# Patient Record
Sex: Female | Born: 1940 | Race: Black or African American | Hispanic: No | Smoking: Current every day smoker
Health system: Southern US, Community
[De-identification: ages and names within clinical notes are randomized; demographics above are authoritative.]

## PROBLEM LIST (undated history)

## (undated) DIAGNOSIS — E785 Hyperlipidemia, unspecified: Secondary | ICD-10-CM

## (undated) DIAGNOSIS — K219 Gastro-esophageal reflux disease without esophagitis: Secondary | ICD-10-CM

## (undated) DIAGNOSIS — N200 Calculus of kidney: Secondary | ICD-10-CM

## (undated) DIAGNOSIS — R413 Other amnesia: Secondary | ICD-10-CM

## (undated) DIAGNOSIS — J45909 Unspecified asthma, uncomplicated: Secondary | ICD-10-CM

## (undated) DIAGNOSIS — J449 Chronic obstructive pulmonary disease, unspecified: Secondary | ICD-10-CM

## (undated) DIAGNOSIS — F329 Major depressive disorder, single episode, unspecified: Secondary | ICD-10-CM

## (undated) DIAGNOSIS — D3500 Benign neoplasm of unspecified adrenal gland: Secondary | ICD-10-CM

## (undated) DIAGNOSIS — I5032 Chronic diastolic (congestive) heart failure: Secondary | ICD-10-CM

## (undated) DIAGNOSIS — E669 Obesity, unspecified: Secondary | ICD-10-CM

## (undated) DIAGNOSIS — I10 Essential (primary) hypertension: Secondary | ICD-10-CM

## (undated) DIAGNOSIS — I2510 Atherosclerotic heart disease of native coronary artery without angina pectoris: Secondary | ICD-10-CM

## (undated) DIAGNOSIS — R040 Epistaxis: Secondary | ICD-10-CM

## (undated) HISTORY — PX: KIDNEY STONE SURGERY: SHX686

## (undated) HISTORY — PX: BUNIONECTOMY: SHX129

## (undated) HISTORY — PX: ABDOMINAL HYSTERECTOMY: SHX81

## (undated) HISTORY — DX: Other amnesia: R41.3

## (undated) HISTORY — PX: ROTATOR CUFF REPAIR: SHX139

## (undated) HISTORY — DX: Chronic obstructive pulmonary disease, unspecified: J44.9

## (undated) HISTORY — DX: Chronic diastolic (congestive) heart failure: I50.32

## (undated) HISTORY — DX: Major depressive disorder, single episode, unspecified: F32.9

## (undated) HISTORY — PX: BLADDER SUSPENSION: SHX72

## (undated) HISTORY — PX: CATARACT EXTRACTION: SUR2

## (undated) HISTORY — DX: Essential (primary) hypertension: I10

## (undated) HISTORY — PX: KNEE ARTHROSCOPY: SUR90

## (undated) HISTORY — PX: CARPAL TUNNEL RELEASE: SHX101

## (undated) HISTORY — DX: Epistaxis: R04.0

---

## 1997-12-04 ENCOUNTER — Ambulatory Visit (HOSPITAL_COMMUNITY): Admission: RE | Admit: 1997-12-04 | Discharge: 1997-12-04 | Payer: Self-pay | Admitting: *Deleted

## 1998-01-01 ENCOUNTER — Ambulatory Visit (HOSPITAL_COMMUNITY): Admission: RE | Admit: 1998-01-01 | Discharge: 1998-01-01 | Payer: Self-pay | Admitting: *Deleted

## 1998-02-07 ENCOUNTER — Ambulatory Visit (HOSPITAL_COMMUNITY): Admission: RE | Admit: 1998-02-07 | Discharge: 1998-02-07 | Payer: Self-pay | Admitting: *Deleted

## 1998-04-25 ENCOUNTER — Ambulatory Visit (HOSPITAL_COMMUNITY): Admission: RE | Admit: 1998-04-25 | Discharge: 1998-04-25 | Payer: Self-pay | Admitting: *Deleted

## 1998-04-29 ENCOUNTER — Ambulatory Visit: Admission: RE | Admit: 1998-04-29 | Discharge: 1998-04-29 | Payer: Self-pay | Admitting: Urology

## 1998-05-22 ENCOUNTER — Inpatient Hospital Stay (HOSPITAL_COMMUNITY): Admission: RE | Admit: 1998-05-22 | Discharge: 1998-05-23 | Payer: Self-pay | Admitting: Urology

## 1998-06-20 ENCOUNTER — Ambulatory Visit (HOSPITAL_COMMUNITY): Admission: RE | Admit: 1998-06-20 | Discharge: 1998-06-20 | Payer: Self-pay | Admitting: Urology

## 1998-06-22 ENCOUNTER — Ambulatory Visit (HOSPITAL_COMMUNITY): Admission: RE | Admit: 1998-06-22 | Discharge: 1998-06-22 | Payer: Self-pay | Admitting: *Deleted

## 1998-06-23 ENCOUNTER — Encounter: Payer: Self-pay | Admitting: Interventional Cardiology

## 1998-06-23 ENCOUNTER — Ambulatory Visit (HOSPITAL_COMMUNITY): Admission: RE | Admit: 1998-06-23 | Discharge: 1998-06-23 | Payer: Self-pay | Admitting: Interventional Cardiology

## 1998-10-22 ENCOUNTER — Encounter: Payer: Self-pay | Admitting: *Deleted

## 1998-10-22 ENCOUNTER — Ambulatory Visit (HOSPITAL_COMMUNITY): Admission: RE | Admit: 1998-10-22 | Discharge: 1998-10-22 | Payer: Self-pay | Admitting: *Deleted

## 1998-12-15 ENCOUNTER — Ambulatory Visit (HOSPITAL_COMMUNITY): Admission: RE | Admit: 1998-12-15 | Discharge: 1998-12-15 | Payer: Self-pay | Admitting: *Deleted

## 1998-12-16 ENCOUNTER — Ambulatory Visit (HOSPITAL_COMMUNITY): Admission: RE | Admit: 1998-12-16 | Discharge: 1998-12-16 | Payer: Self-pay | Admitting: *Deleted

## 1998-12-18 ENCOUNTER — Other Ambulatory Visit: Admission: RE | Admit: 1998-12-18 | Discharge: 1998-12-18 | Payer: Self-pay | Admitting: Obstetrics & Gynecology

## 1998-12-23 ENCOUNTER — Ambulatory Visit (HOSPITAL_COMMUNITY): Admission: RE | Admit: 1998-12-23 | Discharge: 1998-12-23 | Payer: Self-pay | Admitting: *Deleted

## 1998-12-26 ENCOUNTER — Encounter: Payer: Self-pay | Admitting: *Deleted

## 1998-12-26 ENCOUNTER — Ambulatory Visit (HOSPITAL_COMMUNITY): Admission: RE | Admit: 1998-12-26 | Discharge: 1998-12-26 | Payer: Self-pay | Admitting: *Deleted

## 1999-01-29 ENCOUNTER — Ambulatory Visit (HOSPITAL_BASED_OUTPATIENT_CLINIC_OR_DEPARTMENT_OTHER): Admission: RE | Admit: 1999-01-29 | Discharge: 1999-01-29 | Payer: Self-pay | Admitting: Orthopedic Surgery

## 1999-02-09 ENCOUNTER — Encounter: Admission: RE | Admit: 1999-02-09 | Discharge: 1999-03-10 | Payer: Self-pay | Admitting: Orthopedic Surgery

## 1999-03-02 ENCOUNTER — Encounter: Payer: Self-pay | Admitting: Emergency Medicine

## 1999-03-02 ENCOUNTER — Encounter: Payer: Self-pay | Admitting: *Deleted

## 1999-03-02 ENCOUNTER — Inpatient Hospital Stay (HOSPITAL_COMMUNITY): Admission: EM | Admit: 1999-03-02 | Discharge: 1999-03-07 | Payer: Self-pay | Admitting: Emergency Medicine

## 1999-03-03 ENCOUNTER — Encounter: Payer: Self-pay | Admitting: *Deleted

## 1999-04-13 ENCOUNTER — Ambulatory Visit (HOSPITAL_COMMUNITY): Admission: RE | Admit: 1999-04-13 | Discharge: 1999-04-13 | Payer: Self-pay | Admitting: *Deleted

## 1999-05-29 ENCOUNTER — Ambulatory Visit (HOSPITAL_COMMUNITY): Admission: RE | Admit: 1999-05-29 | Discharge: 1999-05-29 | Payer: Self-pay | Admitting: *Deleted

## 1999-05-29 ENCOUNTER — Encounter: Payer: Self-pay | Admitting: *Deleted

## 1999-10-09 ENCOUNTER — Ambulatory Visit (HOSPITAL_COMMUNITY): Admission: RE | Admit: 1999-10-09 | Discharge: 1999-10-09 | Payer: Self-pay | Admitting: *Deleted

## 1999-10-09 ENCOUNTER — Encounter: Payer: Self-pay | Admitting: *Deleted

## 1999-11-03 ENCOUNTER — Other Ambulatory Visit: Admission: RE | Admit: 1999-11-03 | Discharge: 1999-11-03 | Payer: Self-pay | Admitting: Obstetrics

## 1999-11-24 ENCOUNTER — Encounter: Admission: RE | Admit: 1999-11-24 | Discharge: 2000-02-22 | Payer: Self-pay | Admitting: Endocrinology

## 1999-12-29 ENCOUNTER — Encounter: Payer: Self-pay | Admitting: *Deleted

## 1999-12-29 ENCOUNTER — Ambulatory Visit (HOSPITAL_COMMUNITY): Admission: RE | Admit: 1999-12-29 | Discharge: 1999-12-29 | Payer: Self-pay | Admitting: *Deleted

## 2000-02-11 ENCOUNTER — Ambulatory Visit (HOSPITAL_COMMUNITY): Admission: RE | Admit: 2000-02-11 | Discharge: 2000-02-11 | Payer: Self-pay | Admitting: Pulmonary Disease

## 2000-02-11 ENCOUNTER — Encounter: Payer: Self-pay | Admitting: Pulmonary Disease

## 2000-06-20 ENCOUNTER — Ambulatory Visit (HOSPITAL_BASED_OUTPATIENT_CLINIC_OR_DEPARTMENT_OTHER): Admission: RE | Admit: 2000-06-20 | Discharge: 2000-06-20 | Payer: Self-pay | Admitting: Podiatry

## 2000-09-12 ENCOUNTER — Ambulatory Visit (HOSPITAL_COMMUNITY): Admission: RE | Admit: 2000-09-12 | Discharge: 2000-09-12 | Payer: Self-pay | Admitting: *Deleted

## 2001-01-03 ENCOUNTER — Ambulatory Visit (HOSPITAL_COMMUNITY): Admission: RE | Admit: 2001-01-03 | Discharge: 2001-01-03 | Payer: Self-pay | Admitting: *Deleted

## 2001-01-03 ENCOUNTER — Encounter: Payer: Self-pay | Admitting: *Deleted

## 2001-01-19 ENCOUNTER — Ambulatory Visit (HOSPITAL_COMMUNITY): Admission: RE | Admit: 2001-01-19 | Discharge: 2001-01-19 | Payer: Self-pay | Admitting: *Deleted

## 2001-01-19 ENCOUNTER — Encounter: Payer: Self-pay | Admitting: *Deleted

## 2001-03-25 ENCOUNTER — Emergency Department (HOSPITAL_COMMUNITY): Admission: EM | Admit: 2001-03-25 | Discharge: 2001-03-25 | Payer: Self-pay | Admitting: Emergency Medicine

## 2001-03-25 ENCOUNTER — Encounter: Payer: Self-pay | Admitting: Emergency Medicine

## 2001-05-09 ENCOUNTER — Ambulatory Visit (HOSPITAL_COMMUNITY): Admission: RE | Admit: 2001-05-09 | Discharge: 2001-05-09 | Payer: Self-pay | Admitting: *Deleted

## 2001-05-09 ENCOUNTER — Encounter: Payer: Self-pay | Admitting: *Deleted

## 2001-05-24 ENCOUNTER — Ambulatory Visit (HOSPITAL_COMMUNITY): Admission: RE | Admit: 2001-05-24 | Discharge: 2001-05-24 | Payer: Self-pay | Admitting: *Deleted

## 2001-08-24 ENCOUNTER — Ambulatory Visit (HOSPITAL_COMMUNITY): Admission: RE | Admit: 2001-08-24 | Discharge: 2001-08-24 | Payer: Self-pay | Admitting: *Deleted

## 2001-08-24 ENCOUNTER — Encounter (INDEPENDENT_AMBULATORY_CARE_PROVIDER_SITE_OTHER): Payer: Self-pay | Admitting: Specialist

## 2001-10-27 ENCOUNTER — Encounter: Payer: Self-pay | Admitting: *Deleted

## 2001-10-27 ENCOUNTER — Ambulatory Visit (HOSPITAL_COMMUNITY): Admission: RE | Admit: 2001-10-27 | Discharge: 2001-10-27 | Payer: Self-pay | Admitting: *Deleted

## 2002-01-24 ENCOUNTER — Ambulatory Visit (HOSPITAL_COMMUNITY): Admission: RE | Admit: 2002-01-24 | Discharge: 2002-01-24 | Payer: Self-pay | Admitting: *Deleted

## 2002-01-24 ENCOUNTER — Encounter: Payer: Self-pay | Admitting: *Deleted

## 2002-02-20 ENCOUNTER — Ambulatory Visit (HOSPITAL_COMMUNITY): Admission: RE | Admit: 2002-02-20 | Discharge: 2002-02-20 | Payer: Self-pay | Admitting: *Deleted

## 2002-02-20 ENCOUNTER — Encounter: Payer: Self-pay | Admitting: *Deleted

## 2002-12-17 ENCOUNTER — Ambulatory Visit (HOSPITAL_COMMUNITY): Admission: RE | Admit: 2002-12-17 | Discharge: 2002-12-17 | Payer: Self-pay | Admitting: *Deleted

## 2002-12-17 ENCOUNTER — Encounter: Payer: Self-pay | Admitting: *Deleted

## 2002-12-27 ENCOUNTER — Inpatient Hospital Stay (HOSPITAL_COMMUNITY): Admission: EM | Admit: 2002-12-27 | Discharge: 2002-12-28 | Payer: Self-pay | Admitting: Emergency Medicine

## 2002-12-27 ENCOUNTER — Encounter: Payer: Self-pay | Admitting: Emergency Medicine

## 2002-12-28 ENCOUNTER — Encounter: Payer: Self-pay | Admitting: Interventional Cardiology

## 2003-06-14 ENCOUNTER — Encounter: Payer: Self-pay | Admitting: Emergency Medicine

## 2003-06-14 ENCOUNTER — Emergency Department (HOSPITAL_COMMUNITY): Admission: EM | Admit: 2003-06-14 | Discharge: 2003-06-14 | Payer: Self-pay | Admitting: Psychology

## 2004-03-26 ENCOUNTER — Encounter: Admission: RE | Admit: 2004-03-26 | Discharge: 2004-06-24 | Payer: Self-pay | Admitting: Endocrinology

## 2004-08-19 ENCOUNTER — Ambulatory Visit (HOSPITAL_COMMUNITY): Admission: RE | Admit: 2004-08-19 | Discharge: 2004-08-19 | Payer: Self-pay | Admitting: Internal Medicine

## 2005-08-24 ENCOUNTER — Ambulatory Visit (HOSPITAL_COMMUNITY): Admission: RE | Admit: 2005-08-24 | Discharge: 2005-08-24 | Payer: Self-pay | Admitting: Internal Medicine

## 2006-08-25 ENCOUNTER — Ambulatory Visit (HOSPITAL_COMMUNITY): Admission: RE | Admit: 2006-08-25 | Discharge: 2006-08-25 | Payer: Self-pay | Admitting: Internal Medicine

## 2006-11-01 ENCOUNTER — Encounter (HOSPITAL_COMMUNITY): Admission: RE | Admit: 2006-11-01 | Discharge: 2007-01-03 | Payer: Self-pay | Admitting: Interventional Cardiology

## 2007-08-28 ENCOUNTER — Ambulatory Visit (HOSPITAL_COMMUNITY): Admission: RE | Admit: 2007-08-28 | Discharge: 2007-08-28 | Payer: Self-pay | Admitting: Internal Medicine

## 2007-09-25 ENCOUNTER — Inpatient Hospital Stay (HOSPITAL_BASED_OUTPATIENT_CLINIC_OR_DEPARTMENT_OTHER): Admission: RE | Admit: 2007-09-25 | Discharge: 2007-09-25 | Payer: Self-pay | Admitting: Interventional Cardiology

## 2007-09-25 HISTORY — PX: CORONARY STENT PLACEMENT: SHX1402

## 2007-10-02 ENCOUNTER — Inpatient Hospital Stay (HOSPITAL_COMMUNITY): Admission: RE | Admit: 2007-10-02 | Discharge: 2007-10-03 | Payer: Self-pay | Admitting: Interventional Cardiology

## 2008-01-11 ENCOUNTER — Encounter (HOSPITAL_COMMUNITY): Admission: RE | Admit: 2008-01-11 | Discharge: 2008-04-10 | Payer: Self-pay | Admitting: Interventional Cardiology

## 2008-05-08 ENCOUNTER — Encounter: Admission: RE | Admit: 2008-05-08 | Discharge: 2008-05-08 | Payer: Self-pay | Admitting: Internal Medicine

## 2008-05-13 ENCOUNTER — Emergency Department (HOSPITAL_COMMUNITY): Admission: EM | Admit: 2008-05-13 | Discharge: 2008-05-14 | Payer: Self-pay | Admitting: Emergency Medicine

## 2008-05-28 ENCOUNTER — Telehealth: Payer: Self-pay | Admitting: Gastroenterology

## 2008-06-04 ENCOUNTER — Ambulatory Visit: Payer: Self-pay | Admitting: Gastroenterology

## 2008-06-04 DIAGNOSIS — R1011 Right upper quadrant pain: Secondary | ICD-10-CM

## 2008-06-04 LAB — CONVERTED CEMR LAB
ALT: 11 units/L (ref 0–35)
Albumin: 3.6 g/dL (ref 3.5–5.2)
Alkaline Phosphatase: 52 units/L (ref 39–117)
BUN: 11 mg/dL (ref 6–23)
Calcium: 9.7 mg/dL (ref 8.4–10.5)
Chloride: 107 meq/L (ref 96–112)
Creatinine, Ser: 0.8 mg/dL (ref 0.4–1.2)
Eosinophils Absolute: 0.2 10*3/uL (ref 0.0–0.7)
Glucose, Bld: 140 mg/dL — ABNORMAL HIGH (ref 70–99)
Hemoglobin: 13.6 g/dL (ref 12.0–15.0)
Monocytes Absolute: 0.4 10*3/uL (ref 0.1–1.0)
Monocytes Relative: 6.5 % (ref 3.0–12.0)
Neutrophils Relative %: 66.9 % (ref 43.0–77.0)
Platelets: 199 10*3/uL (ref 150–400)
RBC: 4.62 M/uL (ref 3.87–5.11)
RDW: 15.1 % — ABNORMAL HIGH (ref 11.5–14.6)
Sodium: 141 meq/L (ref 135–145)
Total Bilirubin: 0.7 mg/dL (ref 0.3–1.2)

## 2008-06-06 ENCOUNTER — Ambulatory Visit (HOSPITAL_COMMUNITY): Admission: RE | Admit: 2008-06-06 | Discharge: 2008-06-06 | Payer: Self-pay | Admitting: Gastroenterology

## 2008-06-07 ENCOUNTER — Encounter: Payer: Self-pay | Admitting: Gastroenterology

## 2008-07-02 ENCOUNTER — Ambulatory Visit: Payer: Self-pay | Admitting: Gastroenterology

## 2008-07-08 ENCOUNTER — Ambulatory Visit: Payer: Self-pay | Admitting: Gastroenterology

## 2008-07-11 ENCOUNTER — Ambulatory Visit (HOSPITAL_COMMUNITY): Admission: RE | Admit: 2008-07-11 | Discharge: 2008-07-11 | Payer: Self-pay | Admitting: Gastroenterology

## 2008-07-13 ENCOUNTER — Emergency Department (HOSPITAL_COMMUNITY): Admission: EM | Admit: 2008-07-13 | Discharge: 2008-07-13 | Payer: Self-pay | Admitting: Emergency Medicine

## 2008-11-12 ENCOUNTER — Encounter: Admission: RE | Admit: 2008-11-12 | Discharge: 2008-11-12 | Payer: Self-pay | Admitting: Endocrinology

## 2009-05-13 ENCOUNTER — Encounter: Admission: RE | Admit: 2009-05-13 | Discharge: 2009-05-13 | Payer: Self-pay | Admitting: Endocrinology

## 2010-05-04 ENCOUNTER — Emergency Department (HOSPITAL_COMMUNITY): Admission: EM | Admit: 2010-05-04 | Discharge: 2010-05-04 | Payer: Self-pay | Admitting: Family Medicine

## 2010-09-20 ENCOUNTER — Inpatient Hospital Stay (HOSPITAL_COMMUNITY)
Admission: EM | Admit: 2010-09-20 | Discharge: 2010-09-24 | Disposition: A | Discharge status: Still In Hospital | Payer: Self-pay | Source: Home / Self Care | Attending: Emergency Medicine | Admitting: Emergency Medicine

## 2010-09-21 ENCOUNTER — Inpatient Hospital Stay (HOSPITAL_COMMUNITY): Admission: EM | Admit: 2010-09-21 | Discharge: 2010-09-24 | Payer: Self-pay | Admitting: Urology

## 2010-11-03 ENCOUNTER — Ambulatory Visit
Admission: RE | Admit: 2010-11-03 | Discharge: 2010-11-03 | Payer: Self-pay | Source: Home / Self Care | Attending: Urology | Admitting: Urology

## 2010-11-09 LAB — POCT I-STAT 4, (NA,K, GLUC, HGB,HCT)
Glucose, Bld: 187 mg/dL — ABNORMAL HIGH (ref 70–99)
HCT: 42 % (ref 36.0–46.0)
Hemoglobin: 14.3 g/dL (ref 12.0–15.0)
Potassium: 4.5 mEq/L (ref 3.5–5.1)
Sodium: 141 mEq/L (ref 135–145)

## 2010-11-09 LAB — GLUCOSE, CAPILLARY: Glucose-Capillary: 203 mg/dL — ABNORMAL HIGH (ref 70–99)

## 2010-11-17 NOTE — Op Note (Signed)
Caroline Medina, Caroline Medina             ACCOUNT NO.:  [id number]  MEDICAL RECORD NO.:  [id number]          PATIENT TYPE:  AMB  LOCATION:  [facility]                         FACILITY:  [facility]  PHYSICIAN:  Danae Chen, M.D.  DATE OF BIRTH:  08-25-1941  DATE OF PROCEDURE:  11/03/2010 DATE OF DISCHARGE:                              OPERATIVE REPORT   PREOPERATIVE DIAGNOSES:  Right ureteral stone and urosepsis.  POSTOPERATIVE DIAGNOSES:  Right ureteral stone and urosepsis.  PROCEDURE:  Cystoscopy, right retrograde pyelogram, ureteroscopy, holmium laser of right ureteral stone, fragmentation of a right lower pole renal calculus, stone extraction and insertion of double-J stent.  SURGEON:  Danae Chen, M.D.  ANESTHESIA:  General.  INDICATIONS:  The patient is a 70 year old female who had a double-J stent inserted on September 21, 2010, for a 5-mm proximal right ureteral calculus, hydronephrosis and urosepsis.  She was treated with Rocephin for E. coli UTI and sent home on Septra.  She is now admitted for cystoscopy, ureteroscopy, retrograde pyelogram and holmium laser of the ureteral stone and stone extraction.  The patient was identified by her wrist band and proper time-out was taken.  Under general anesthesia, she was prepped and draped and placed in the dorsal lithotomy position.  A panendoscope was inserted in the bladder. The bladder mucosa is normal.  There is some edema around the right ureteral orifice where the distal curl of the double-J stent is seen. There is no stone or tumor in the bladder.  The double-J stent was grasped with a grasping forceps and pulled out of the urethra.  Then, a sensor wire was passed through the double-J stent up into the renal pelvis.  The double-J stent was then removed.  A cone-tip catheter was passed through the cystoscope into the right ureteral orifice.  Contrast was then injected through the cone-tip catheter.  The distal and mid ureter appear  normal.  There is a filling defect in the proximal ureter consistent with the known ureteral calculus.  The renal pelvis and calices appear normal.  The cone-tip catheter was then removed.  The ureteroscope access sheath was then passed over the guidewire up to the level of the known ureteral stone and the sensor wire was removed. A digital ureteroscope was then passed through the ureteroscope access sheath and the stone was visualized in the proximal ureter.  With the holmium laser, the stone was then fragmented in smaller fragments and the stone fragments were removed with the nitinol basket.  The ureteroscope was then advanced in the renal pelvis.  The calices were carefully examined.  There is a stone in the lower pole calyx.  The stone was caught within the wire of the stone basket and the stone is friable and the stone was fragmented in multiple small fragments.  The larger stone fragments were then removed with the nitinol basket.  The smaller fragments could not be caught within the wires of the basket and they are very small and she should be able to pass them spontaneously.  The sensor wire was then passed through the ureteroscope access sheath and the ureteroscope access sheath was removed.  The guidewire was then backloaded into the cystoscope and a #6-French - 26 double-J stent was passed over the guidewire.  The proximal curl of the double-J stent is in the renal pelvis.  The distal curl is in the bladder.  The bladder was then emptied and the cystoscope and guidewire were removed.  The patient tolerated the procedure well and left the OR in satisfactory condition to post anesthesia care unit.     Danae Chen, M.D.     MN/MEDQ  D:  11/03/2010  T:  11/03/2010  Job:  [id number]  cc:   Della Goo, M.D. Fax: [phone]  Electronically Signed by Lindaann Slough M.D. on 11/17/2010 11:23:40 AM

## 2011-01-05 LAB — GLUCOSE, CAPILLARY
Glucose-Capillary: 214 mg/dL — ABNORMAL HIGH (ref 70–99)
Glucose-Capillary: 217 mg/dL — ABNORMAL HIGH (ref 70–99)
Glucose-Capillary: 220 mg/dL — ABNORMAL HIGH (ref 70–99)
Glucose-Capillary: 224 mg/dL — ABNORMAL HIGH (ref 70–99)
Glucose-Capillary: 229 mg/dL — ABNORMAL HIGH (ref 70–99)
Glucose-Capillary: 236 mg/dL — ABNORMAL HIGH (ref 70–99)

## 2011-01-05 LAB — BASIC METABOLIC PANEL
GFR calc non Af Amer: 49 mL/min — ABNORMAL LOW (ref >60)
Glucose, Bld: 228 mg/dL — ABNORMAL HIGH (ref 70–99)
Sodium: 138 mEq/L (ref 135–145)

## 2011-01-06 LAB — COMPREHENSIVE METABOLIC PANEL
ALT: 11 U/L (ref 0–35)
BUN: 12 mg/dL (ref 6–23)
CO2: 24 mEq/L (ref 19–32)
Calcium: 9.2 mg/dL (ref 8.4–10.5)
GFR calc non Af Amer: 51 mL/min — ABNORMAL LOW (ref >60)
Glucose, Bld: 183 mg/dL — ABNORMAL HIGH (ref 70–99)
Sodium: 139 mEq/L (ref 135–145)

## 2011-01-06 LAB — COMPREHENSIVE METABOLIC PANEL WITH GFR
AST: 19 U/L (ref 0–37)
Albumin: 3.7 g/dL (ref 3.5–5.2)
Alkaline Phosphatase: 64 U/L (ref 39–117)
Chloride: 108 meq/L (ref 96–112)
Creatinine, Ser: 1.07 mg/dL (ref 0.4–1.2)
GFR calc Af Amer: >60 mL/min (ref >60)
Potassium: 4.2 meq/L (ref 3.5–5.1)
Total Bilirubin: 0.9 mg/dL (ref 0.3–1.2)
Total Protein: 7.6 g/dL (ref 6.0–8.3)

## 2011-01-06 LAB — URINE MICROSCOPIC-ADD ON

## 2011-01-06 LAB — DIFFERENTIAL
Basophils Absolute: 0 K/uL (ref 0.0–0.1)
Basophils Relative: 0 % (ref 0–1)
Eosinophils Absolute: 0 10*3/uL (ref 0.0–0.7)
Eosinophils Relative: 0 % (ref 0–5)
Lymphocytes Relative: 3 % — ABNORMAL LOW (ref 12–46)
Lymphs Abs: 0.6 10*3/uL — ABNORMAL LOW (ref 0.7–4.0)
Monocytes Absolute: 0.8 K/uL (ref 0.1–1.0)
Monocytes Relative: 5 % (ref 3–12)
Neutro Abs: 15.9 10*3/uL — ABNORMAL HIGH (ref 1.7–7.7)
Neutrophils Relative %: 92 % — ABNORMAL HIGH (ref 43–77)

## 2011-01-06 LAB — URINE CULTURE
Colony Count: >=100000
Culture  Setup Time: 201111280011

## 2011-01-06 LAB — CULTURE, BLOOD (ROUTINE X 2)
Culture  Setup Time: 201111280009
Culture  Setup Time: 201111280009

## 2011-01-06 LAB — URINALYSIS, ROUTINE W REFLEX MICROSCOPIC
Bilirubin Urine: NEGATIVE
Glucose, UA: NEGATIVE mg/dL
Ketones, ur: NEGATIVE mg/dL
Nitrite: NEGATIVE
Protein, ur: NEGATIVE mg/dL
Specific Gravity, Urine: 1.019 (ref 1.005–1.030)
Urobilinogen, UA: 1 mg/dL (ref 0.0–1.0)
pH: 5.5 (ref 5.0–8.0)

## 2011-01-06 LAB — CBC
HCT: 44.6 % (ref 36.0–46.0)
Hemoglobin: 14.7 g/dL (ref 12.0–15.0)
MCH: 29.5 pg (ref 26.0–34.0)
MCHC: 33 g/dL (ref 30.0–36.0)
MCV: 89.4 fL (ref 78.0–100.0)
Platelets: 200 K/uL (ref 150–400)
RBC: 4.99 MIL/uL (ref 3.87–5.11)
RDW: 14.7 % (ref 11.5–15.5)
WBC: 17.2 K/uL — ABNORMAL HIGH (ref 4.0–10.5)

## 2011-01-06 LAB — LIPASE, BLOOD: Lipase: 32 U/L (ref 11–59)

## 2011-03-09 NOTE — Cardiovascular Report (Signed)
Caroline Medina, Caroline Medina NO.:  [id number]   MEDICAL RECORD NO.:  [id number]          PATIENT TYPE:  OIB   LOCATION:  1962                         FACILITY:  [facility]   PHYSICIAN:  Lyn Records, M.D.   DATE OF BIRTH:  1941/08/27   DATE OF PROCEDURE:  09/25/2007  DATE OF DISCHARGE:  09/25/2007                            CARDIAC CATHETERIZATION   REFERRING PHYSICIAN:  Georgann Housekeeper, M.D.   INDICATION:  Recurring episodes of chest discomfort at rest.  The  patient does not use sublingual nitroglycerin because of headache.  She  has a previous history of multiple balloon angioplasties in the mid  1990s by Dr. Kandace Blitz.   PROCEDURE PERFORMED:  1. Left heart catheterization.  2. Selective coronary angiography.  3. Left ventriculography.  4. Intracoronary nitroglycerin administration.   DESCRIPTION:  After informed consent and following 2 mg of IV Versed, a  4-French sheath was placed in the right femoral artery using the  modified Seldinger technique.  A 4-French A2 multipurpose catheter was  then used for hemodynamic recordings, left ventriculography by hand  injection, and selective right coronary angiography.  We also gave the  patient 200 mcg of intracoronary nitroglycerin via this catheter.   We removed the multipurpose catheter and used a #4 and also a #5 4-  French left Judkins catheter for left coronary artery angiography.  The  #4 left Judkins selectively engaged the LAD and the #5 4-French left  Judkins catheter selectively engaged the circumflex.  The patient had a  very short left main.  No complications occurred.  Hemostasis was  achieved with manual compression.   RESULTS:  1. Hemodynamic data:      a.     Aortic pressure 152/86.      b.     Left ventricular pressure 156/16.  2. Left ventriculography:  Left ventricular cavity may be mildly      dilated.  LV function is normal.  No regurgitation is noted.  EF is      estimated to be 60%.  There may  be mild inferior wall hypokinesis.  3. Coronary angiography.      a.     Left main coronary:  The left main coronary is relatively       short and contains no significant obstruction.      b.     Left anterior descending coronary:  LAD is moderately       calcified, especially in the proximal and mid segment.  There is       eccentric 50% LAD obstruction in the origin of the first septal       perforator.  The LAD is otherwise irregular and wraps around the       left ventricular apex.  The LAD gives origin to the 2 diagonal       branches.      c.     Circumflex artery:  Circumflex coronary artery is a large       vessel that gives origin to 2 obtuse marginal branches.  The first       obtuse marginal arises  proximally and contains luminal       irregularities, but no high-grade obstruction.  The second obtuse       marginal is also large and bifurcates.  It also contains luminal       irregularities.  No high-grade obstruction is seen.      d.     Right coronary:  The right coronary is a dominant tortuous       vessel.  There is a proximal shepherd's crook.  The mid vessel       contains a region of calcification and an eccentric 65-75%       stenosis.  There also appears to be a high-grade stenosis in the       distal vessel within a very tight bend; this region is calcified.       This region could not be clearly laid out with the images obtained       during this diagnostic procedure.  There appears to be a 70-80%       stenosis in this region, although contrast variability may be       preventing ability to adequately assess this region.  The PDA has       large 3 left ventricular branches that arise distal to the PDA.   CONCLUSION:  1. Probably significant right coronary disease with a borderline      significant mid right coronary artery stenosis and what appears to      be a tight stenosis in the right coronary artery within a tight      bend within the region of  calcification.  The distal bed is large.      The left anterior descending and circumflex contain irregularities,      but no high-grade obstruction is noted.  2. Normal left ventricular function.   PLAN:  Consider PCI on the mid and distal RCA.      Lyn Records, M.D.  Electronically Signed     HWS/MEDQ  D:  09/25/2007  T:  09/26/2007  Job:  [id number]

## 2011-03-09 NOTE — Cardiovascular Report (Signed)
Caroline Medina, Caroline Medina             ACCOUNT NO.:  [id number]   MEDICAL RECORD NO.:  [id number]          PATIENT TYPE:  OIB   LOCATION:  6532                         FACILITY:  [facility]   PHYSICIAN:  Lyn Records, M.D.   DATE OF BIRTH:  Aug 09, 1941   DATE OF PROCEDURE:  10/02/2007  DATE OF DISCHARGE:                            CARDIAC CATHETERIZATION   INDICATION:  Severe distal right coronary artery stenosis within a  greater than 40-degree bend beyond an area of tortuosity and also  moderate mid RCA disease.   PROCEDURES PERFORMED:  1. Drug-eluting stent implantation distal right coronary artery within      the region of tortuosity.  2. Drug-eluting stent implantation in mid right coronary artery.  3. Buddy wire.  4. Angio-Seal.   DESCRIPTION:  The patient was brought to cath lab.  She had been  diagnosed of having right coronary disease in the [facility] lab.  We knew that  there was moderate-to-moderately severe disease in the mid right  coronary, but there was also suspicion that there was a distal RCA  lesion hidden by tortuosity within an acutely angulated region of the  distal right coronary.  We were able to lay this out in the cath lab,  although not as well as I would have liked to.  This region contains at  least a 90% stenosis in the distal right coronary with a 45-50 degrees  angle.  We were able to cross the stenosis using the [facility] Prowater  guidewire.  Had to use multiple projections to facilitate crossing.  Once across, we were able to dilate with a 3.0 x 15 and 3.0 x 20-mm long  Maverick balloon.  We had difficulty getting the balloons to go across  the stenosis because of the angulation and tortuosity.  With this in  mind, I went ahead and placed a BMW buddy wire.  This gave [place] better  stability in the vessel and allowed [place] to get a PROMUS 3.0 x 23-mm long  stent into position.  We deployed the stent to pressures of 14  atmospheres.  We then used a Dura Star 10-mm long  post-dilatation  noncompliant balloon and did 3 balloon inflations up to 16 atmospheres  distally in the mid and in the proximal portion of the implanted stent.   We then turned our attention to the mid right coronary.  This required  also a buddy wire to get the stent down the vessel into position.  We  primarily stented the mid right coronary with a 3.5 x 15-mm long PROMUS  stent.  We post-dilated with a 12-mm long Quantum 3.75-mm diameter  balloon to 17 atmospheres.  Intracoronary nitroglycerin was given  postprocedure and a followup angiography was performed demonstrating a  nice angiographic result.   The patient received a bolus and infusion of Angiomax.  ACT was  documented to be greater than 300.  The patient had been started on  Plavix a week prior to the procedure.  The patient received 300 mg of  Plavix on the morning of the procedure.  The ASAHI Prowater wire was  used  primarily to cross the initial lesions in the right coronary and  then we used a BMW wire to serve as a buddy support.  We used a  shepherd's crook 3.5 #6-French right coronary guide catheter that  provided reasonably good support.   Angio-Seal was performed with good hemostasis.   CONCLUSION:  1. Successful deployment of a 23 x 3.0-mm PROMUS stent into the distal      right coronary beyond significant regions of tortuosity and      postdilated to 3.3-mm diameter.  2. Successful deployment of the PROMUS drug-eluting stent in the mid      RCA with reduction in stenosis from 70% to 0%.  3. TIMI grade 3 flow was noted beyond the distal most placed stent and      a good angiographic result was felt to be present.   PLAN:  Aspirin and Plavix for a year.  Discharge in a.m. if no bleeding  or other complications.      Lyn Records, M.D.  Electronically Signed     HWS/MEDQ  D:  10/02/2007  T:  10/03/2007  Job:  [id number]

## 2011-03-12 NOTE — Op Note (Signed)
[facility]  Patient:    Caroline Medina, Caroline Medina                    MRN: [id number] Proc. Date: 06/20/00 Adm. Date:  [id number] Attending:  Nelma Rothman                           Operative Report  DATE OF BIRTH:  24-Jan-1941  SURGEON:  Ezequiel Kayser. Ajooney, D.P.M.  ASSISTANT:  Cordella Register, D.P.M.  PREOPERATIVE DIAGNOSES: 1. 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 2. Painful hyperkeratotic lesion with prominent sesamoid right foot tibial sesamoid. 3. Dorsal exosectomy right foot.  POSTOPERATIVE DIAGNOSES: 1. 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 2. Painful hyperkeratotic lesion with prominent sesamoid right foot tibial sesamoid. 3. Dorsal exosectomy right foot.  PROCEDURES: 1. Bunionectomy with osteotomy and internal fixation utilizing Bionx pin - right foot. 2. Partial excision tibial sesamoid - right foot. 3. Dorsal exostectomy - right foot.  ANESTHESIA:  MAC with local.  COMPLICATIONS:  None.  HEMOSTASIS:  Pneumatic ankle tourniquet inflated 250 mmHg.  ESTIMATED BLOOD LOSS:  Less than 5 cc.  DESCRIPTION OF PROCEDURE:  Patient was brought into the OR and placed in supine position, at which time, monitored anesthesia was administered.  A local block was performed with 18.5 cc of one mixture of 2% lidocaine plain and 0.5% Marcaine plain.  A well-padded ______ ______ .  Patient was prepped and draped in usual aseptic manner.  Foot was exsanguinated with an Esmarch bandage ______ tourniquet was inflated to 250 mmHg.  Attention was directed to the first ray where an dorsal ______  incision was made.  ______ DD:  06/20/00 TD:  06/20/00 Job: [id number]

## 2011-03-12 NOTE — H&P (Signed)
NAME:  Caroline Medina, Caroline Medina                       ACCOUNT NO.:  [id number]   MEDICAL RECORD NO.:  [id number]                   PATIENT TYPE:  INP   LOCATION:  4732                                 FACILITY:  [facility]   PHYSICIAN:  Lyn Records, M.D.                DATE OF BIRTH:  [birth year]   DATE OF ADMISSION:  12/27/2002  DATE OF DISCHARGE:                                HISTORY & PHYSICAL   IMPRESSION:  (As dictated by Dr. Verdis Prime.)  1. Atypical chest, posterior neck, and arm symptoms; positive lower     extremity swelling in this 70 year old diabetic female with known history     of coronary artery disease.  Rule out myocardial ischemia as etiology.     She is currently discomfort free on intravenous nitrates.     Electrocardiogram revealed inferior nonspecific ST-T wave abnormalities.     First set of cardiac enzymes are negative, and chest x-ray is negative     for congestive heart failure, though it does have pulmonary vascular     congestion noted.  Her brain natriuretic peptide is pending.  Her last     ischemic workup in April 2002 involves stress Cardiolite in the office of     Dr. Katrinka Blazing which was negative for ischemia and scar, positive breast     attenuation, ejection fraction 71%.  2. Severe asthma with only intermittent use of Advair and Serevent.  She is     on Singulair.  Her oxygen saturation was acceptable at 94%.  She is     wheezing on her exam despite albuterol nebulizer.  She was seen by Dr.     Sung Amabile in the past but not for the last few years.  3. Diabetes mellitus, type 2, followed by Dr. Juleen China.  4. Obesity, weight approximately 260 pounds, height 5 feet 4 inches     estimated.  5. Dyslipidemia on Lipitor.  6. History of gastroesophageal reflux disease for which she is on Nexium.  7. Lower extremity swelling over the last few days; her Actos had been     recently increased, and this may be a player there.  8. Recently completed a course of antibiotics  (Cipro) for bronchitis.     Similarly, completed antibiotic course about two months early and feel     symptoms are improved.   PLAN:  (As dictated by Dr. Verdis Prime.)  1. Admit to telemetry with rule-out-MI protocol, serial cardiac enzymes and     daily EKG.  IV nitroglycerin.  No heparin or Lovenox as she is currently     discomfort free and her first set of enzymes were negative.  An EKG     showed only nonspecific changes.  2. If she rules out, will plan for dobutamine Cardiolite in the morning to     assess for evidence of myocardial ischemia.  3. Pulmonary consult to assist with management of  questionable     bronchitis/asthma exacerbation.  4. A 2-D echocardiogram to assess LV function or evidence of regional wall     motion abnormalities or evidence of cor pulmonale.  5. Check BNP and coags.   HISTORY OF PRESENT ILLNESS:  The patient is a very pleasant 70 year old  female with ongoing tobacco abuse, dyslipidemia, and history of diabetes.  She has a known history of CAD with prior cardiac catheterization in the  past by Dr. Daisy Floro.  Her last ischemic workup in April 2002 involved a  stress Cardiolite which was negative for ischemia (Dr. Michaelle Copas office).  Details of cardiac catheterization are not available at this time.   The patient has been treated for the past few months for bronchitis and  questionable pneumonia with course of antibiotics, most recently completed a  course of Cipro.  She has a congested cough, spasms of which make her dizzy,  but that is improving.  History of asthma,  only uses her MDI on a p.r.n.  basis, however.  The patient has chronic dyspnea on exertion related to  asthma.  Yesterday she noted lower extremity swelling, left posterior neck  achiness, left arm numbness, and felt more dyspneic with exertion than her  baseline. She presented to [facility] where chest x-ray revealed  pulmonary vascular congestion, an EKG with nonspecific T wave  abnormalities  in the inferior leads.  The first set of cardiac enzymes were negative.  BNP  is pending.   PAST MEDICAL HISTORY:  1. Coronary atherosclerotic heart disease.  Prior cardiac catheterizations     by Dr. Daisy Floro some years earlier with details pending.  Her last     ischemic workup was stress Cardiolite April 2002 which was negative for     ischemia, positive for breast attentuations, EF 71%.  2. Hyperlipidemia for which she is on Lipitor.  3. Diabetes mellitus for the last 8 to 10 years.  4. COPD/asthma.  5. Hypertension.  6. Obesity with weight approximately 260.  7. GERD resolved with Nexium.  8. Ongoing tobacco abuse.  9. Pneumonia.  10.      Cataracts.  11.      Insomnia treated with Ambien successfully.   PAST SURGICAL HISTORY:  1. Hysterectomy without BSO.  2. Right knee arthroscopic surgery in 1999.  3. Left rotator cuff repair in 2000.  4. Bilateral wrist carpal tunnel release with only transient improvement in     symptoms.  5. Right foot surgery to bone spurs and callus removal.  6. History of bladder tack with good results.   ALLERGIES:  PENICILLIN causes rash and pruritus.  She is okay with seafood,  shellfish, and iodine products.   MEDICATIONS:  1. Glipizide 10 mg p.o. b.i.d.  2. Glucophage 1000 mg p.o. b.i.d.  3. Actos 30 mg, change to 45 mg per day about one month earlier.  4. Dilacor 180 mg p.o. daily.  5. Bumex 2 mg p.o. daily.  6. Klor-Con 20 mEq p.o. daily.  7. Enteric-coated aspirin 325 mg p.o. daily.  8. Lipitor 10 mg p.o. q.h.s.  9. Nexium 40 mg p.o. daily.  10.      Imdur 20 mg p.o. t.i.d.  11.      Zoloft 50 mg p.o. daily.  12.      Ambien 10 mg p.o. q.h.s. p.r.n. not often taken.  13.      Clarinex 5 mg p.o. daily.  14.      Singulair 10 mg p.o. daily.  15.      Advair Diskus 250/50 one puff b.i.d. p.r.n.  16.      Nasonex nasal spray 2 sprays b.i.d.  17.      Estratest tablets p.o. daily. 18.      Status post Cipro course  approximately two weeks earlier for     bronchitis.  19.      Tums 1 tablet p.o. every other day   SOCIAL HISTORY:  Tobacco: One pack per day for 40 years.  She is interested  in smoking cessation.  ETOH:  Negative.  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  She lives  with her son.   FAMILY HISTORY:  Father died in his 21s of MI.  Mother died of complications  of renal disease, had hypertension. She was in her 66s.  One sister is alive  and well at age 49, does have a touch of diabetes mellitus and  hypertension.  One daughter deceased at age 49, complications related to  flu.  Son, age 40, alive and well.   REVIEW OF SYSTEMS:  As in HPI and Past Medical History, otherwise episodic  lightheadedness with fast position changes and with coughing as mentioned.  Negative dysphagia to food or fluids, does have cataracts bilateral eyes.  Hearing is okay.  No symptoms of GERD on Nexium.  Negative melena and bright  red blood per rectum.  No nausea, constipation, or diarrhea.  Negative  dysuria and hematuria.  Complaint affecting her right foot.   PHYSICAL EXAMINATION:  (As performed by Dr. Verdis Prime.)  VITAL SIGNS:  Blood pressure 155/73, currently 123/63; heart rate 77 and  regular; respiratory rate 20; O2 saturation 94%.  GENERAL: Obese, pleasant, 70 year old female in no current discomfort.  Her  son is in attendance.  NECK:  Brisk bilateral carotid upstroke without bruit and without JVD.  CHEST:  Inspiratory and expiratory wheezes, diffuse throughout.  CARDIAC:  Regular rate and rhythm without murmur, rub, or gallop.  Normal S1  and S2.  EXTREMITIES:  Positive mild pitting edema bilateral lower extremities with  intact pulses.  ABDOMEN: Obese, nondistended.  Normoactive bowel sounds.  Negative abdominal  aortic, renal, and femoral bruit.  Nontender to applied pressures.  No  masses or organomegaly appreciated, though difficult  exam secondary to  obesity.   LABORATORY TEST AND DATA:  Chest x-ray:  Low volumes, pulmonary vascular  congestion with cardiac enlargement.  No focal infiltrate.  Negative frank  edema.   EKG revealed normal sinus rhythm with ST downsloping and T wave  abnormalities in III and aVF.  This change consistent with EKG 01/2001.   Sodium 141, potassium 3.8, chloride 109, CO2 23, BUN 12, creatinine 0.8,  glucose 214.  LFTs within normal range.  Hemoglobin 15.5, hematocrit 45.6,  WBC 10.6, platelets 225.  CK 192, MB fracture 3.5, troponin I 0.02.     Salomon Fick, N.P.                       Lyn Records, M.D.    MES/MEDQ  D:  12/28/2002  T:  12/28/2002  Job:  [id number]   cc:   Sharyn Dross., M.D.  [address]  [address]  [place]  [place] [address]  Fax: [phone]   Brooke Bonito, M.D.  [address]  [place]  [place] [address]  Fax: [phone]   Onalee Hua  Ree Kida, M.D. [facility]

## 2011-03-12 NOTE — Op Note (Signed)
[place]  Patient:    Caroline Medina, Caroline Medina                    MRN: [id number] Proc. Date: 06/20/00 Adm. Date:  [id number] Disc. Date: [id number] Attending:  Nelma Rothman                           Operative Report  REDICTATION  PREOPERATIVE DIAGNOSES: 1. Adductovalgus deformity, right foot. 2. Painful and enlarged tibial sesamoid, left foot. 3. Symptomatic dorsal exostosis, right foot.  POSTOPERATIVE DIAGNOSES: 1. Adductovalgus deformity, right foot. 2. Painful and enlarged tibial sesamoid, right foot. 3. Symptomatic dorsal exostosis, right foot.  PROCEDURE: 1. Bunionectomy with Austin osteotomy and internal fixation, right foot. 2. Partial excision of tibial sesamoid, left foot. 3. Dorsal exostectomy of cuneiform, right foot.  SURGEON:  Larey Dresser, D.P.M.  ASSISTANT:  None.  COMPLICATIONS:  None.  ESTIMATED BLOOD LOSS:  Less than 5 cc.  HEMOSTASIS:  Pneumatic ankle tourniquet inflated to 250 mmHg.  ANESTHESIA:  MAC with local anesthesia.  DESCRIPTION OF PROCEDURE:  The patient was brought to the OR and placed in the supine position, at which time, monitored anesthesia care was administered.  a local block was performed with a 1:1 mixture of 2% lidocaine plain and 0.5% Marcaine plain to anesthetize the right foot.  A well-padded pneumatic ankle tourniquet was applied to the medial malleolus.  The patient was prepped and draped in the usual aseptic manner.  The foot was exsanguinated with an Esmarch bandage and the previously applied tourniquet to inflated to 250 mmHg.  Attention was directed to the first ray where a dorsal linear incision was made. The incision was deepened via sharp and blunt modalities, taking care to clamp and cauterize all bleeding vessels and ensure retraction of all neurovascular structures encountered.  The deep and superficial fascia was separately medially and dorsally the length of the  incision.  Attention was then drawn to the first interspace where dissection was carried from the level of the deep transverse intermetatarsal ligament which was freed at this time.  The adductor tendon was freed from the base of the proximal phalanx.  The fibular sesamoid was also freed at this time.  An inverted L-capsulotomy was then made on the dorsal aspect of the first metatarsal.  The periosteum capsule was freed from the head of the first metatarsal.  Once exposure had been obtained, medial eminence was resected parallel with the shaft utilizing sagittal saw.  Next, a V-oriented osteotomy was made, taking care not to disrupt the sesamoid apparatus.  The capital fragment was transposed laterally the desired amount and impacted into the shaft.  This was temporarily stabilized utilizing a 0.062 K-wire.  Next, fixation was obtained utilizing a 2.0 mm bion absorbable pin.  The K-wire that stabilized the osteotomy site was removed and the osteotomy site was found to be stable with this pin.  The remaining medial eminence was resected utilizing sagittal saw and all rough edges were burred with the rotary bur.  The area was irrigated with copious amounts of sterile saline.  The surgical site was irrigated with copious amounts of sterile saline.  A medial capsulorrhaphy was performed and deep closure was accomplished using 3-0 and 4-0 Dexon.  Skin closure was then accomplished in a horizontal mattress fashion.  An incision was made medially, just superior to the area of the tibial sesamoid.  The incision was deepened via  sharp and blunt modalities, taking care to clamp and cauterize all bleeding vessels, and ensure retraction of all neurovascular structures encountered.  The capsule was freed, allowing for exposure of the tibial sesamoid.  This was found to be enlarged and prominent, and the plantar half of the tibial sesamoid was resected utilizing the sagittal saw and excised.  The  area was irrigated with copious amounts of sterile saline.  Deep closure was accomplished using Dexon suture and skin closure was accomplished using nylon suture in a horizontal mattress fashion.  Next, attention was directed dorsally to the right foot where there was a noted palpable exostosis at the cuneiform.  An incision was made dorsally and laterally overlying this palpable exostosis.  The incision was deepened via sharp and blunt modalities, taking care to clamp and cauterize all bleeding vessels and ensure retraction of all neurovascular structures encountered. The incision was carried to the level of bone and once exposure had been obtained of the prominent exostosis which was resected utilizing an osteotome and mallet.  All rough edges were smooth with a rasp.  The area was irrigated with copious amounts of sterile saline.  All deep closure was accomplished using Dexon suture and skin closure was accomplished using nylon suture in a horizontal mattress fashion.  During the case, the tourniquet had to be deflated and reinflated due to problems with controlling of bleeding.  Once all the wounds were dressed with dry gauze sterile bulky bandage, the tourniquet was deflated and vascular status returned to all digits.  The patient was sent to the recovery room with vital signs stable and capillary refill time at presurgical levels.  Both written and oral postoperative instructions were given to the patient.  Postoperative shoe was dispensed.  All questions were answered.  The patient is instructed to contact the office if there are any problems, and will continue to follow her up for postoperative care. DD:  07/14/00 TD:  07/16/00 Job: [id number]

## 2011-07-23 LAB — URINALYSIS, ROUTINE W REFLEX MICROSCOPIC
Glucose, UA: NEGATIVE
Ketones, ur: NEGATIVE
Nitrite: NEGATIVE
Protein, ur: NEGATIVE

## 2011-07-23 LAB — DIFFERENTIAL
Basophils Absolute: 0
Basophils Relative: 0
Eosinophils Absolute: 0.1
Eosinophils Relative: 2
Monocytes Absolute: 0.4

## 2011-07-23 LAB — CBC
HCT: 42.6
Hemoglobin: 14.1
MCHC: 33.1
RDW: 16.1 — ABNORMAL HIGH

## 2011-07-23 LAB — POCT I-STAT, CHEM 8
Calcium, Ion: 1.09 — ABNORMAL LOW
HCT: 44
TCO2: 25

## 2011-07-23 LAB — URINE MICROSCOPIC-ADD ON

## 2011-08-02 LAB — BASIC METABOLIC PANEL
BUN: 14
GFR calc non Af Amer: >60
Potassium: 3.5

## 2011-08-02 LAB — CBC
HCT: 39.3
Platelets: 191
WBC: 8.8

## 2011-08-02 LAB — POCT I-STAT GLUCOSE
Glucose, Bld: 142 — ABNORMAL HIGH
Operator id: 221371

## 2011-09-29 ENCOUNTER — Encounter: Payer: Self-pay | Admitting: *Deleted

## 2011-09-29 ENCOUNTER — Emergency Department (HOSPITAL_COMMUNITY)
Admission: EM | Admit: 2011-09-29 | Discharge: 2011-09-29 | Disposition: A | Discharge status: Home / Self Care | Payer: Medicare Other | Source: Home / Self Care | Attending: Emergency Medicine | Admitting: Emergency Medicine

## 2011-09-29 DIAGNOSIS — R21 Rash and other nonspecific skin eruption: Secondary | ICD-10-CM

## 2011-09-29 HISTORY — DX: Atherosclerotic heart disease of native coronary artery without angina pectoris: I25.10

## 2011-09-29 HISTORY — DX: Calculus of kidney: N20.0

## 2011-09-29 MED ORDER — TRIAMCINOLONE ACETONIDE 0.1 % EX LOTN: TOPICAL_LOTION | Freq: Two times a day (BID) | CUTANEOUS | Status: AC

## 2011-09-29 MED ORDER — PREDNISONE 20 MG PO TABS: 20.0000 mg | ORAL_TABLET | Freq: Every day | ORAL | Status: AC

## 2011-09-29 NOTE — ED Notes (Signed)
Rash face /chest/back of neck onset approx 3 - 4 hours ago itching  - denies new detergent/lotions or hair shampoo - per pt ate shrimp last night and today - has never had reaction to shrimp in the past - at onset difficulty swallowing which has resolved

## 2011-09-29 NOTE — ED Provider Notes (Signed)
History     CSN: [id number] Arrival date & time: 09/29/2011  7:02 PM   First MD Initiated Contact with Patient 09/29/11 1718      Chief Complaint  Patient presents with  . Rash    (Consider location/radiation/quality/duration/timing/severity/associated sxs/prior treatment) HPI Comments: It itches, i had some shrimps yesterday and today was working getting some christmas tree ornaments and my face started, itching, its on my face neck,   No sob, no pain when swallowing  Patient is a 70 y.o. female presenting with rash. The history is provided by the patient.  Rash  This is a new problem. The current episode started 3 to 5 hours ago. The problem has not changed since onset.The problem is associated with nothing. There has been no fever. The rash is present on the face and neck. The patient is experiencing no pain. The pain has been constant since onset. Associated symptoms include itching. Pertinent negatives include no weeping. She has tried nothing for the symptoms. The treatment provided no relief.    Past Medical History  Diagnosis Date  . Coronary artery disease   . Diabetes mellitus   . Arthritis   . High cholesterol   . Asthma   . Back pain   . Leg pain   . Kidney stones     Past Surgical History  Procedure Date  . Coronary stent placement   . Kidney stone surgery   . Abdominal hysterectomy     History reviewed. No pertinent family history.  History  Substance Use Topics  . Smoking status: Current Everyday Smoker  . Smokeless tobacco: Not on file  . Alcohol Use: No    OB History    Grav Para Term Preterm Abortions TAB SAB Ect Mult Living                  Review of Systems  Constitutional: Negative.  Negative for fever.  HENT: Negative for hearing loss and congestion.   Skin: Positive for itching and rash. Negative for color change.    Allergies  Penicillins  Home Medications   Current Outpatient Rx  Name Route Sig Dispense Refill  .  ALBUTEROL SULFATE HFA 108 (90 BASE) MCG/ACT IN AERS Inhalation Inhale 2 puffs into the lungs every 6 (six) hours as needed.      . ALPRAZOLAM 0.25 MG PO TABS Oral Take by mouth 2 (two) times daily.      . ASPIRIN 81 MG PO TABS Oral Take 81 mg by mouth daily.      Marland Kitchen CLONIDINE HCL 0.1 MG PO TABS Oral Take by mouth 2 (two) times daily.      Marland Kitchen FLUTICASONE-SALMETEROL 100-50 MCG/DOSE IN AEPB Inhalation Inhale 1 puff into the lungs every 12 (twelve) hours.      Marland Kitchen HYDROCODONE-ACETAMINOPHEN 5-500 MG PO TABS Oral Take 1 tablet by mouth every 6 (six) hours as needed.      Marland Kitchen METFORMIN HCL 1000 MG PO TABS Oral Take 1,000 mg by mouth 2 (two) times daily with a meal.      . MONTELUKAST SODIUM 10 MG PO TABS Oral Take 10 mg by mouth at bedtime.      Marland Kitchen POTASSIUM CHLORIDE 10 MEQ PO TBCR Oral Take by mouth daily.      Marland Kitchen ROSUVASTATIN CALCIUM 10 MG PO TABS Oral Take by mouth daily.      Marland Kitchen SITAGLIPTIN PHOSPHATE 25 MG PO TABS Oral Take 25 mg by mouth daily.      Marland Kitchen  VALSARTAN 160 MG PO TABS Oral Take by mouth daily.      Marland Kitchen PREDNISONE 20 MG PO TABS Oral Take 1 tablet (20 mg total) by mouth daily. 5 tablet 0  . TRIAMCINOLONE ACETONIDE 0.1 % EX LOTN Topical Apply topically 2 (two) times daily. Use for 7 days 60 mL 0    BP 160/71  Pulse 70  Temp(Src) 98.6 F (37 C) (Oral)  Resp 16  SpO2 100%  Physical Exam  Nursing note and vitals reviewed. Constitutional: She appears well-developed and well-nourished.  HENT:  Head: Normocephalic.  Mouth/Throat: Uvula is midline, oropharynx is clear and moist and mucous membranes are normal. No oropharyngeal exudate.  Neck: No JVD present.    Pulmonary/Chest: Breath sounds normal. She has no decreased breath sounds. She has no wheezes. She has no rhonchi. She has no rales.  Lymphadenopathy:    She has no cervical adenopathy.    She has no axillary adenopathy.  Skin: Skin is warm. She is not diaphoretic.    ED Course  Procedures (including critical care time)  Labs  Reviewed - No data to display No results found.   1. Papular rash       MDM  Facial and neck and posterior papular eruption geographical, exposure like-        Jimmie Molly, MD 09/29/11 2234

## 2012-03-22 ENCOUNTER — Encounter: Payer: Self-pay | Admitting: Gastroenterology

## 2012-04-19 ENCOUNTER — Encounter: Payer: Self-pay | Admitting: Gastroenterology

## 2012-04-19 ENCOUNTER — Ambulatory Visit (AMBULATORY_SURGERY_CENTER): Payer: Medicare Other | Admitting: *Deleted

## 2012-04-19 VITALS — Ht 67.0 in | Wt 227.1 lb

## 2012-04-19 DIAGNOSIS — Z1211 Encounter for screening for malignant neoplasm of colon: Secondary | ICD-10-CM

## 2012-04-19 MED ORDER — MOVIPREP 100 G PO SOLR: ORAL | Status: DC

## 2012-04-19 MED ORDER — FERROUS SULFATE 325 (65 FE) MG PO TABS: 325.0000 mg | ORAL_TABLET | Freq: Every day | ORAL | Status: DC

## 2012-04-26 ENCOUNTER — Telehealth: Payer: Self-pay | Admitting: Gastroenterology

## 2012-04-28 NOTE — Telephone Encounter (Signed)
Spoke with patient she understands to hold iron medication for 5 days prior to colonoscopy. Patient's question was why Dr.Jacobs sent her prescription for iron at her pharmacy [facility]), she states she gets this at another pharmacy by Dr.Jenkins. Explained to patient this was a mistake. Called [facility] spoke with "Morrie Sheldon" explained to her to discontinue iron that this was sent in error.

## 2012-05-03 ENCOUNTER — Encounter: Payer: Self-pay | Admitting: Gastroenterology

## 2012-05-03 ENCOUNTER — Ambulatory Visit (AMBULATORY_SURGERY_CENTER): Payer: Medicare Other | Admitting: Gastroenterology

## 2012-05-03 VITALS — BP 111/58 | HR 52 | Temp 97.1°F | Resp 97 | Ht 67.0 in | Wt 227.0 lb

## 2012-05-03 DIAGNOSIS — D126 Benign neoplasm of colon, unspecified: Secondary | ICD-10-CM

## 2012-05-03 DIAGNOSIS — Z1211 Encounter for screening for malignant neoplasm of colon: Secondary | ICD-10-CM

## 2012-05-03 LAB — GLUCOSE, CAPILLARY
Glucose-Capillary: 148 mg/dL — ABNORMAL HIGH (ref 70–99)
Glucose-Capillary: 146 mg/dL — ABNORMAL HIGH (ref 70–99)

## 2012-05-03 MED ORDER — SODIUM CHLORIDE 0.9 % IV SOLN: 500.0000 mL | INTRAVENOUS | Status: DC

## 2012-05-03 NOTE — Op Note (Signed)
[facility] [address] [place], [place]  [address]  COLONOSCOPY PROCEDURE REPORT  PATIENT:  Caroline, Medina  MR#:  [id number] BIRTHDATE:  03-24-1941, 70 yrs. old  GENDER:  female ENDOSCOPIST:  Rachael Fee, MD PROCEDURE DATE:  05/03/2012 PROCEDURE:  Colonoscopy with snare polypectomy ASA CLASS:  Class II INDICATIONS:  Routine Risk Screening MEDICATIONS:   Fentanyl 75 mcg IV, These medications were titrated to patient response per physician's verbal order, Versed 7 mg IV  DESCRIPTION OF PROCEDURE:   After the risks benefits and alternatives of the procedure were thoroughly explained, informed consent was obtained.  Digital rectal exam was performed and revealed no rectal masses.   The LB [id number] endoscope was introduced through the anus and advanced to the cecum, which was identified by both the appendix and ileocecal valve, without limitations.  The quality of the prep was good..  The instrument was then slowly withdrawn as the colon was fully examined. <<PROCEDUREIMAGES>> FINDINGS:  A diminutive polyp was found in the sigmoid colon. This was removed with cold snare and sent to pathology (jar 1) (see image3).  Mild diverticulosis was found in the sigmoid to descending colon segments.  This was otherwise a normal examination of the colon (see image4, image2, and image1). Retroflexed views in the rectum revealed no abnormalities. COMPLICATIONS:  None  ENDOSCOPIC IMPRESSION: 1) Diminutive polyp in the sigmoid colon, removed and sent to pathology 2) Mild diverticulosis in the sigmoid to descending colon segments 3) Otherwise normal examination  RECOMMENDATIONS: 1) If the polyp(s) removed today are proven to be adenomatous (pre-cancerous) polyps, you will need a repeat colonoscopy in 5 years. Otherwise you should continue to follow colorectal cancer screening guidelines for "routine risk" patients with colonoscopy in 10 years. You will receive a  letter within 1-2 weeks with the results of your biopsy as well as final recommendations. Please call my office if you have not received a letter after 3 weeks.  ______________________________ Rachael Fee, MD  n. eSIGNED:   Rachael Fee at 05/03/2012 10:57 AM  Zollie Scale, [phone]

## 2012-05-03 NOTE — Patient Instructions (Addendum)
One of your biggest health concerns is your smoking.  This increases your risk for most cancers and serious cardiovascular diseases such as strokes, heart attacks.  You should try your best to stop.  If you need assistance, please contact your PCP or Smoking Cessation Class at [facility] [phone]) or [place] (1-800-QUIT-NOW).   YOU HAD AN ENDOSCOPIC PROCEDURE TODAY AT [facility]: Refer to the procedure report that was given to you for any specific questions about what was found during the examination.  If the procedure report does not answer your questions, please call your gastroenterologist to clarify.  If you requested that your care partner not be given the details of your procedure findings, then the procedure report has been included in a sealed envelope for you to review at your convenience later.  YOU SHOULD EXPECT: Some feelings of bloating in the abdomen. Passage of more gas than usual.  Walking can help get rid of the air that was put into your GI tract during the procedure and reduce the bloating. If you had a lower endoscopy (such as a colonoscopy or flexible sigmoidoscopy) you may notice spotting of blood in your stool or on the toilet paper. If you underwent a bowel prep for your procedure, then you may not have a normal bowel movement for a few days.  DIET: Your first meal following the procedure should be a light meal and then it is ok to progress to your normal diet.  A half-sandwich or bowl of soup is an example of a good first meal.  Heavy or fried foods are harder to digest and may make you feel nauseous or bloated.  Likewise meals heavy in dairy and vegetables can cause extra gas to form and this can also increase the bloating.  Drink plenty of fluids but you should avoid alcoholic beverages for 24 hours.  ACTIVITY: Your care partner should take you home directly after the procedure.  You should plan to take it easy, moving slowly for the rest of  the day.  You can resume normal activity the day after the procedure however you should NOT DRIVE or use heavy machinery for 24 hours (because of the sedation medicines used during the test).    SYMPTOMS TO REPORT IMMEDIATELY: A gastroenterologist can be reached at any hour.  During normal business hours, 8:30 AM to 5:00 PM Monday through Friday, call [phone].  After hours and on weekends, please call the GI answering service at [phone] who will take a message and have the physician on call contact you.   Following lower endoscopy (colonoscopy or flexible sigmoidoscopy):  Excessive amounts of blood in the stool  Significant tenderness or worsening of abdominal pains  Swelling of the abdomen that is new, acute  Fever of 100F or higher  Following upper endoscopy (EGD)  Vomiting of blood or coffee ground material  New chest pain or pain under the shoulder blades  Painful or persistently difficult swallowing  New shortness of breath  Fever of 100F or higher  Black, tarry-looking stools  FOLLOW UP: If any biopsies were taken you will be contacted by phone or by letter within the next 1-3 weeks.  Call your gastroenterologist if you have not heard about the biopsies in 3 weeks.  Our staff will call the home number listed on your records the next business day following your procedure to check on you and address any questions or concerns that you may have at that time regarding  given to you following your procedure. This is a courtesy call and so if there is no answer at the home number and we have not heard from you through the emergency physician on call, we will assume that you have returned to your regular daily activities without incident.  SIGNATURES/CONFIDENTIALITY: You and/or your care partner have signed paperwork which will be entered into your electronic medical record.  These signatures attest to the fact that that the information above on your After Visit  Summary has been reviewed and is understood.  Full responsibility of the confidentiality of this discharge information lies with you and/or your care-partner.  

## 2012-05-03 NOTE — Progress Notes (Signed)
Patient did not experience any of the following events: a burn prior to discharge; a fall within the facility; wrong site/side/patient/procedure/implant event; or a hospital transfer or hospital admission upon discharge from the facility. (G8907) Patient did not have preoperative order for IV antibiotic SSI prophylaxis. (G8918)  

## 2012-05-04 ENCOUNTER — Telehealth: Payer: Self-pay | Admitting: *Deleted

## 2012-05-04 NOTE — Telephone Encounter (Signed)
  Follow up Call-  Call back number 05/03/2012  Post procedure Call Back phone  # [phone]  Permission to leave phone message Yes     Patient questions:  Do you have a fever, pain , or abdominal swelling? no Pain Score  0 *  Have you tolerated food without any problems? yes  Have you been able to return to your normal activities? yes  Do you have any questions about your discharge instructions: Diet   no Medications  no Follow up visit  no  Do you have questions or concerns about your Care? no  Actions: * If pain score is 4 or above: No action needed, pain <4.

## 2012-05-15 ENCOUNTER — Encounter: Payer: Self-pay | Admitting: Gastroenterology

## 2013-10-01 ENCOUNTER — Other Ambulatory Visit (HOSPITAL_COMMUNITY): Payer: Self-pay | Admitting: Internal Medicine

## 2013-10-01 ENCOUNTER — Ambulatory Visit (HOSPITAL_COMMUNITY)
Admission: RE | Admit: 2013-10-01 | Discharge: 2013-10-01 | Disposition: A | Discharge status: Home / Self Care | Payer: Medicare Other | Source: Ambulatory Visit | Attending: Internal Medicine | Admitting: Internal Medicine

## 2013-10-01 DIAGNOSIS — M719 Bursopathy, unspecified: Secondary | ICD-10-CM | POA: Insufficient documentation

## 2013-10-01 DIAGNOSIS — M25519 Pain in unspecified shoulder: Secondary | ICD-10-CM | POA: Insufficient documentation

## 2013-10-01 DIAGNOSIS — M47812 Spondylosis without myelopathy or radiculopathy, cervical region: Secondary | ICD-10-CM | POA: Insufficient documentation

## 2013-10-01 DIAGNOSIS — M5410 Radiculopathy, site unspecified: Secondary | ICD-10-CM

## 2013-10-01 DIAGNOSIS — M67919 Unspecified disorder of synovium and tendon, unspecified shoulder: Secondary | ICD-10-CM | POA: Insufficient documentation

## 2013-10-14 ENCOUNTER — Encounter (HOSPITAL_COMMUNITY): Payer: Self-pay | Admitting: Radiology

## 2013-10-14 ENCOUNTER — Emergency Department (HOSPITAL_COMMUNITY): Payer: Medicare Other

## 2013-10-14 ENCOUNTER — Inpatient Hospital Stay (HOSPITAL_COMMUNITY)
Admission: EM | Admit: 2013-10-14 | Discharge: 2013-10-17 | DRG: 247 | Disposition: A | Discharge status: Home / Self Care | Payer: Medicare Other | Attending: Interventional Cardiology | Admitting: Interventional Cardiology

## 2013-10-14 DIAGNOSIS — E119 Type 2 diabetes mellitus without complications: Secondary | ICD-10-CM | POA: Diagnosis present

## 2013-10-14 DIAGNOSIS — M129 Arthropathy, unspecified: Secondary | ICD-10-CM | POA: Diagnosis present

## 2013-10-14 DIAGNOSIS — F17200 Nicotine dependence, unspecified, uncomplicated: Secondary | ICD-10-CM | POA: Diagnosis present

## 2013-10-14 DIAGNOSIS — Z7982 Long term (current) use of aspirin: Secondary | ICD-10-CM

## 2013-10-14 DIAGNOSIS — I214 Non-ST elevation (NSTEMI) myocardial infarction: Principal | ICD-10-CM

## 2013-10-14 DIAGNOSIS — Z6837 Body mass index (BMI) 37.0-37.9, adult: Secondary | ICD-10-CM

## 2013-10-14 DIAGNOSIS — K219 Gastro-esophageal reflux disease without esophagitis: Secondary | ICD-10-CM

## 2013-10-14 DIAGNOSIS — E669 Obesity, unspecified: Secondary | ICD-10-CM | POA: Diagnosis present

## 2013-10-14 DIAGNOSIS — Z87442 Personal history of urinary calculi: Secondary | ICD-10-CM

## 2013-10-14 DIAGNOSIS — E1140 Type 2 diabetes mellitus with diabetic neuropathy, unspecified: Secondary | ICD-10-CM | POA: Diagnosis present

## 2013-10-14 DIAGNOSIS — D3500 Benign neoplasm of unspecified adrenal gland: Secondary | ICD-10-CM

## 2013-10-14 DIAGNOSIS — I119 Hypertensive heart disease without heart failure: Secondary | ICD-10-CM | POA: Diagnosis present

## 2013-10-14 DIAGNOSIS — R634 Abnormal weight loss: Secondary | ICD-10-CM | POA: Diagnosis present

## 2013-10-14 DIAGNOSIS — F329 Major depressive disorder, single episode, unspecified: Secondary | ICD-10-CM | POA: Diagnosis present

## 2013-10-14 DIAGNOSIS — R079 Chest pain, unspecified: Secondary | ICD-10-CM

## 2013-10-14 DIAGNOSIS — J45909 Unspecified asthma, uncomplicated: Secondary | ICD-10-CM | POA: Diagnosis present

## 2013-10-14 DIAGNOSIS — E785 Hyperlipidemia, unspecified: Secondary | ICD-10-CM | POA: Diagnosis present

## 2013-10-14 DIAGNOSIS — F3289 Other specified depressive episodes: Secondary | ICD-10-CM | POA: Diagnosis present

## 2013-10-14 DIAGNOSIS — Z9861 Coronary angioplasty status: Secondary | ICD-10-CM

## 2013-10-14 DIAGNOSIS — Z955 Presence of coronary angioplasty implant and graft: Secondary | ICD-10-CM

## 2013-10-14 DIAGNOSIS — I2510 Atherosclerotic heart disease of native coronary artery without angina pectoris: Secondary | ICD-10-CM | POA: Diagnosis present

## 2013-10-14 DIAGNOSIS — Z720 Tobacco use: Secondary | ICD-10-CM | POA: Diagnosis present

## 2013-10-14 HISTORY — DX: Benign neoplasm of unspecified adrenal gland: D35.00

## 2013-10-14 HISTORY — DX: Hyperlipidemia, unspecified: E78.5

## 2013-10-14 HISTORY — DX: Gastro-esophageal reflux disease without esophagitis: K21.9

## 2013-10-14 HISTORY — DX: Obesity, unspecified: E66.9

## 2013-10-14 HISTORY — DX: Unspecified asthma, uncomplicated: J45.909

## 2013-10-14 LAB — CBC WITH DIFFERENTIAL/PLATELET
Eosinophils Absolute: 0.1 10*3/uL (ref 0.0–0.7)
Hemoglobin: 15.9 g/dL — ABNORMAL HIGH (ref 12.0–15.0)
Lymphocytes Relative: 11 % — ABNORMAL LOW (ref 12–46)
Lymphs Abs: 1.1 10*3/uL (ref 0.7–4.0)
MCH: 29.8 pg (ref 26.0–34.0)
MCHC: 34.3 g/dL (ref 30.0–36.0)
Monocytes Relative: 4 % (ref 3–12)
Neutro Abs: 8.7 10*3/uL — ABNORMAL HIGH (ref 1.7–7.7)
Neutrophils Relative %: 85 % — ABNORMAL HIGH (ref 43–77)
Platelets: 237 10*3/uL (ref 150–400)
RBC: 5.34 MIL/uL — ABNORMAL HIGH (ref 3.87–5.11)
WBC: 10.2 10*3/uL (ref 4.0–10.5)

## 2013-10-14 LAB — POCT I-STAT TROPONIN I: Troponin i, poc: 0.02 ng/mL (ref 0.00–0.08)

## 2013-10-14 LAB — BASIC METABOLIC PANEL
BUN: 4 mg/dL — ABNORMAL LOW (ref 6–23)
CO2: 29 mEq/L (ref 19–32)
Chloride: 98 mEq/L (ref 96–112)
Creatinine, Ser: 0.65 mg/dL (ref 0.50–1.10)
GFR calc non Af Amer: 87 mL/min — ABNORMAL LOW (ref >90)
Glucose, Bld: 390 mg/dL — ABNORMAL HIGH (ref 70–99)
Potassium: 3.6 mEq/L (ref 3.5–5.1)
Sodium: 138 mEq/L (ref 135–145)

## 2013-10-14 LAB — GLUCOSE, CAPILLARY: Glucose-Capillary: 319 mg/dL — ABNORMAL HIGH (ref 70–99)

## 2013-10-14 LAB — PRO B NATRIURETIC PEPTIDE: Pro B Natriuretic peptide (BNP): 151.5 pg/mL — ABNORMAL HIGH (ref 0–125)

## 2013-10-14 MED ORDER — NITROGLYCERIN 0.4 MG SL SUBL
0.4000 mg | SUBLINGUAL_TABLET | SUBLINGUAL | Status: DC | PRN
  Administered 2013-10-15: 0.4 mg via SUBLINGUAL
  Filled 2013-10-14: qty 25

## 2013-10-14 MED ORDER — HEPARIN BOLUS VIA INFUSION
4000.0000 [IU] | Freq: Once | INTRAVENOUS | Status: AC
  Administered 2013-10-14: 4000 [IU] via INTRAVENOUS
  Filled 2013-10-14: qty 4000

## 2013-10-14 MED ORDER — INSULIN ASPART 100 UNIT/ML ~~LOC~~ SOLN
10.0000 [IU] | Freq: Once | SUBCUTANEOUS | Status: AC
  Administered 2013-10-14: 10 [IU] via SUBCUTANEOUS
  Filled 2013-10-14: qty 1

## 2013-10-14 MED ORDER — ALPRAZOLAM 0.25 MG PO TABS
0.2500 mg | ORAL_TABLET | Freq: Two times a day (BID) | ORAL | Status: DC
  Administered 2013-10-15 – 2013-10-17 (×5): 0.25 mg via ORAL
  Filled 2013-10-14 (×5): qty 1

## 2013-10-14 MED ORDER — ASPIRIN 81 MG PO CHEW: 324.0000 mg | CHEWABLE_TABLET | Freq: Once | ORAL | Status: DC

## 2013-10-14 MED ORDER — IRBESARTAN 150 MG PO TABS
150.0000 mg | ORAL_TABLET | Freq: Every day | ORAL | Status: DC
  Administered 2013-10-15 – 2013-10-17 (×3): 150 mg via ORAL
  Filled 2013-10-14 (×3): qty 1

## 2013-10-14 MED ORDER — NITROGLYCERIN IN D5W 200-5 MCG/ML-% IV SOLN: 3.0000 ug/min | INTRAVENOUS | Status: DC

## 2013-10-14 MED ORDER — HEPARIN (PORCINE) IN NACL 100-0.45 UNIT/ML-% IJ SOLN
1000.0000 [IU]/h | INTRAMUSCULAR | Status: DC
  Administered 2013-10-14: 1000 [IU]/h via INTRAVENOUS
  Filled 2013-10-14 (×2): qty 250

## 2013-10-14 MED ORDER — IOHEXOL 350 MG/ML SOLN
100.0000 mL | Freq: Once | INTRAVENOUS | Status: AC | PRN
  Administered 2013-10-14: 100 mL via INTRAVENOUS

## 2013-10-14 MED ORDER — MONTELUKAST SODIUM 10 MG PO TABS
10.0000 mg | ORAL_TABLET | Freq: Every day | ORAL | Status: DC
  Administered 2013-10-15 – 2013-10-16 (×2): 10 mg via ORAL
  Filled 2013-10-14 (×5): qty 1

## 2013-10-14 MED ORDER — SODIUM CHLORIDE 0.9 % IJ SOLN
3.0000 mL | Freq: Two times a day (BID) | INTRAMUSCULAR | Status: DC
  Administered 2013-10-15 – 2013-10-16 (×2): 3 mL via INTRAVENOUS

## 2013-10-14 MED ORDER — MOMETASONE FURO-FORMOTEROL FUM 100-5 MCG/ACT IN AERO: 2.0000 | INHALATION_SPRAY | Freq: Two times a day (BID) | RESPIRATORY_TRACT | Status: DC

## 2013-10-14 MED ORDER — MOMETASONE FURO-FORMOTEROL FUM 100-5 MCG/ACT IN AERO
2.0000 | INHALATION_SPRAY | Freq: Two times a day (BID) | RESPIRATORY_TRACT | Status: DC
  Administered 2013-10-15 – 2013-10-17 (×5): 2 via RESPIRATORY_TRACT
  Filled 2013-10-14 (×2): qty 8.8

## 2013-10-14 MED ORDER — DILTIAZEM HCL ER 240 MG PO CP24
240.0000 mg | ORAL_CAPSULE | Freq: Every day | ORAL | Status: DC
  Administered 2013-10-15 – 2013-10-17 (×3): 240 mg via ORAL
  Filled 2013-10-14 (×3): qty 1

## 2013-10-14 MED ORDER — SODIUM CHLORIDE 0.9 % IJ SOLN: 3.0000 mL | INTRAMUSCULAR | Status: DC | PRN

## 2013-10-14 MED ORDER — NITROGLYCERIN 0.4 MG SL SUBL
0.4000 mg | SUBLINGUAL_TABLET | SUBLINGUAL | Status: DC | PRN
  Administered 2013-10-14: 0.4 mg via SUBLINGUAL

## 2013-10-14 MED ORDER — ATORVASTATIN CALCIUM 20 MG PO TABS
20.0000 mg | ORAL_TABLET | Freq: Every day | ORAL | Status: DC
  Administered 2013-10-15 – 2013-10-16 (×2): 20 mg via ORAL
  Filled 2013-10-14 (×3): qty 1

## 2013-10-14 MED ORDER — ONDANSETRON HCL 4 MG/2ML IJ SOLN: 4.0000 mg | Freq: Four times a day (QID) | INTRAMUSCULAR | Status: DC | PRN

## 2013-10-14 MED ORDER — INSULIN ASPART 100 UNIT/ML ~~LOC~~ SOLN
0.0000 [IU] | Freq: Three times a day (TID) | SUBCUTANEOUS | Status: DC
  Administered 2013-10-15: 8 [IU] via SUBCUTANEOUS
  Administered 2013-10-16: 3 [IU] via SUBCUTANEOUS
  Administered 2013-10-16 (×2): 8 [IU] via SUBCUTANEOUS
  Administered 2013-10-17: 3 [IU] via SUBCUTANEOUS

## 2013-10-14 MED ORDER — MORPHINE SULFATE 4 MG/ML IJ SOLN
4.0000 mg | Freq: Once | INTRAMUSCULAR | Status: AC
  Administered 2013-10-14: 4 mg via INTRAVENOUS
  Filled 2013-10-14: qty 1

## 2013-10-14 MED ORDER — PANTOPRAZOLE SODIUM 40 MG PO TBEC
40.0000 mg | DELAYED_RELEASE_TABLET | Freq: Every day | ORAL | Status: DC
  Administered 2013-10-15 – 2013-10-17 (×3): 40 mg via ORAL
  Filled 2013-10-14 (×3): qty 1

## 2013-10-14 MED ORDER — NITROGLYCERIN IN D5W 200-5 MCG/ML-% IV SOLN
5.0000 ug/min | INTRAVENOUS | Status: DC
  Administered 2013-10-14: 5 ug/min via INTRAVENOUS
  Filled 2013-10-14: qty 250

## 2013-10-14 MED ORDER — ALBUTEROL SULFATE HFA 108 (90 BASE) MCG/ACT IN AERS: 2.0000 | INHALATION_SPRAY | Freq: Four times a day (QID) | RESPIRATORY_TRACT | Status: DC | PRN

## 2013-10-14 MED ORDER — SODIUM CHLORIDE 0.9 % IV SOLN: 250.0000 mL | INTRAVENOUS | Status: DC | PRN

## 2013-10-14 MED ORDER — ASPIRIN EC 81 MG PO TBEC
81.0000 mg | DELAYED_RELEASE_TABLET | Freq: Every day | ORAL | Status: DC
  Administered 2013-10-15 – 2013-10-17 (×3): 81 mg via ORAL
  Filled 2013-10-14 (×3): qty 1

## 2013-10-14 MED ORDER — CLONIDINE HCL 0.1 MG PO TABS
0.1000 mg | ORAL_TABLET | Freq: Two times a day (BID) | ORAL | Status: DC
  Administered 2013-10-15 – 2013-10-17 (×5): 0.1 mg via ORAL
  Filled 2013-10-14 (×7): qty 1

## 2013-10-14 MED ORDER — AMITRIPTYLINE HCL 25 MG PO TABS
25.0000 mg | ORAL_TABLET | Freq: Every day | ORAL | Status: DC
  Administered 2013-10-15 – 2013-10-16 (×2): 25 mg via ORAL
  Filled 2013-10-14 (×4): qty 1

## 2013-10-14 MED ORDER — NITROGLYCERIN PEDIATRIC IV INFUSION 100 MCG/ML: 200.0000 ug/min | INTRAVENOUS | Status: DC

## 2013-10-14 MED ORDER — ACETAMINOPHEN 325 MG PO TABS
650.0000 mg | ORAL_TABLET | ORAL | Status: DC | PRN
  Administered 2013-10-15: 650 mg via ORAL
  Filled 2013-10-14: qty 2

## 2013-10-14 MED ORDER — LINAGLIPTIN 5 MG PO TABS
5.0000 mg | ORAL_TABLET | Freq: Every day | ORAL | Status: DC
  Administered 2013-10-16 – 2013-10-17 (×2): 5 mg via ORAL
  Filled 2013-10-14 (×3): qty 1

## 2013-10-14 NOTE — Progress Notes (Signed)
ANTICOAGULATION CONSULT NOTE - Initial Consult  Pharmacy Consult for heparin Indication: chest pain/ACS  Allergies  Allergen Reactions  . Penicillins Rash    Patient Measurements: Height: 5\' 2"  (157.5 cm) Weight: 205 lb (92.987 kg) IBW/kg (Calculated) : 50.1 Heparin Dosing Weight: 75kg  Vital Signs: Temp: 97.9 F (36.6 C) (12/21 1843) Temp src: Oral (12/21 1843) BP: 188/84 mmHg (12/21 2230) Pulse Rate: 76 (12/21 2230)  Labs:  Recent Labs  10/14/13 1903  HGB 15.9*  HCT 46.4*  PLT 237  CREATININE 0.65  TROPONINI <0.30    Estimated Creatinine Clearance: 67.5 ml/min (by C-G formula based on Cr of 0.65).   Medical History: Past Medical History  Diagnosis Date  . Coronary artery disease   . Diabetes mellitus   . Hyperlipidemia   . Asthma   . Kidney stones   . Hypertension     Assessment: 72yo female c/o prolonged chest discomfort and mild abdominal tenderness, found w/ nonspecific ST changes and negative initial cardiac enzymes, to begin heparin.  Goal of Therapy:  Heparin level 0.3-0.7 units/ml Monitor platelets by anticoagulation protocol: Yes   Plan:  Will give heparin 4000 units IV bolus x1 followed by gtt at 1000 units/hr and monitor heparin levels and CBC.  Vernard Gambles, PharmD, BCPS  10/14/2013,11:07 PM

## 2013-10-14 NOTE — ED Notes (Signed)
Pt c/o substernal cp that started yesterday, with radiation to back. Pt has nausea and vomiting, was given 4mg  Zofran en route. Pt also received 324 ASA and 1 Nitro with no relief. Pt rates pain 9/10. Pt reports hx of MI, Stents, HTN, and DM. Pt did not take HTN meds today. 18g LAC.

## 2013-10-14 NOTE — ED Provider Notes (Signed)
CSN: [id number]     Arrival date & time 10/14/13  1840 History   First MD Initiated Contact with Patient 10/14/13 1855     Chief Complaint  Patient presents with  . Chest Pain   (Consider location/radiation/quality/duration/timing/severity/associated sxs/prior Treatment) HPI Comments: Presents to the ER for evaluation of chest pain. Patient reports that the pain began yesterday. She reports that the pain is across the upper chest and radiates into the back. She thought it was indigestion. Pain lasted until she went to sleep last night, but when she awakened this morning she did not have the pain. The pain returned during the course of the day. She has mild shortness of breath. There is no diaphoresis. Patient has had nausea and vomiting.  Patient is a 72 y.o. female presenting with chest pain.  Chest Pain Associated symptoms: nausea and shortness of breath     Past Medical History  Diagnosis Date  . Coronary artery disease   . Diabetes mellitus   . Arthritis   . High cholesterol   . Asthma   . Back pain   . Leg pain   . Kidney stones    Past Surgical History  Procedure Laterality Date  . Coronary stent placement    . Kidney stone surgery    . Abdominal hysterectomy     No family history on file. History  Substance Use Topics  . Smoking status: Current Every Day Smoker  . Smokeless tobacco: Not on file  . Alcohol Use: No   OB History   Grav Para Term Preterm Abortions TAB SAB Ect Mult Living                 Review of Systems  Respiratory: Positive for shortness of breath.   Cardiovascular: Positive for chest pain.  Gastrointestinal: Positive for nausea.  All other systems reviewed and are negative.    Allergies  Penicillins  Home Medications   Current Outpatient Rx  Name  Route  Sig  Dispense  Refill  . albuterol (PROVENTIL HFA;VENTOLIN HFA) 108 (90 BASE) MCG/ACT inhaler   Inhalation   Inhale 2 puffs into the lungs every 6 (six) hours as needed.            . ALPRAZolam (XANAX) 0.25 MG tablet   Oral   Take by mouth 2 (two) times daily.           Marland Kitchen amitriptyline (ELAVIL) 25 MG tablet   Oral   Take 1 tablet by mouth Daily.         Marland Kitchen aspirin 81 MG tablet   Oral   Take 81 mg by mouth daily.           . cloNIDine (CATAPRES) 0.1 MG tablet   Oral   Take by mouth 2 (two) times daily.           . ferrous sulfate 325 (65 FE) MG tablet   Oral   Take 325 mg by mouth daily with breakfast.         . fish oil-omega-3 fatty acids 1000 MG capsule   Oral   Take 2 g by mouth daily.         . Fluticasone-Salmeterol (ADVAIR) 100-50 MCG/DOSE AEPB   Inhalation   Inhale 1 puff into the lungs every 12 (twelve) hours.           Marland Kitchen HYDROcodone-acetaminophen (VICODIN) 5-500 MG per tablet   Oral   Take 1 tablet by mouth every 6 (six) hours as  needed.           . metFORMIN (GLUCOPHAGE) 1000 MG tablet   Oral   Take 1,000 mg by mouth 2 (two) times daily with a meal.           . montelukast (SINGULAIR) 10 MG tablet   Oral   Take 10 mg by mouth at bedtime.           . Multiple Vitamins-Minerals (MULTIVITAMIN WITH MINERALS) tablet   Oral   Take 1 tablet by mouth daily.         Marland Kitchen omeprazole (PRILOSEC) 20 MG capsule   Oral   Take 1 capsule by mouth Daily.         . rosuvastatin (CRESTOR) 10 MG tablet   Oral   Take by mouth daily.           . sitaGLIPtin (JANUVIA) 25 MG tablet   Oral   Take 25 mg by mouth daily.           . valsartan (DIOVAN) 160 MG tablet   Oral   Take by mouth daily.            BP 167/78  Pulse 63  Temp(Src) 97.9 F (36.6 C) (Oral)  Resp 16  Ht 5\' 2"  (1.575 m)  Wt 205 lb (92.987 kg)  BMI 37.49 kg/m2  SpO2 99% Physical Exam  Constitutional: She is oriented to person, place, and time. She appears well-developed and well-nourished. No distress.  HENT:  Head: Normocephalic and atraumatic.  Right Ear: Hearing normal.  Left Ear: Hearing normal.  Nose: Nose normal.  Mouth/Throat:  Oropharynx is clear and moist and mucous membranes are normal.  Eyes: Conjunctivae and EOM are normal. Pupils are equal, round, and reactive to light.  Neck: Normal range of motion. Neck supple.  Cardiovascular: Regular rhythm, S1 normal and S2 normal.  Exam reveals no gallop and no friction rub.   No murmur heard. Pulmonary/Chest: Effort normal and breath sounds normal. No respiratory distress. She exhibits no tenderness.  Abdominal: Soft. Normal appearance and bowel sounds are normal. There is no hepatosplenomegaly. There is no tenderness. There is no rebound, no guarding, no tenderness at McBurney's point and negative Murphy's sign. No hernia.  Musculoskeletal: Normal range of motion.  Neurological: She is alert and oriented to person, place, and time. She has normal strength. No cranial nerve deficit or sensory deficit. Coordination normal. GCS eye subscore is 4. GCS verbal subscore is 5. GCS motor subscore is 6.  Skin: Skin is warm, dry and intact. No rash noted. No cyanosis.  Psychiatric: She has a normal mood and affect. Her speech is normal and behavior is normal. Thought content normal.    ED Course  Procedures (including critical care time) Labs Review Labs Reviewed  CBC WITH DIFFERENTIAL - Abnormal; Notable for the following:    RBC 5.34 (*)    Hemoglobin 15.9 (*)    HCT 46.4 (*)    Neutrophils Relative % 85 (*)    Neutro Abs 8.7 (*)    Lymphocytes Relative 11 (*)    All other components within normal limits  BASIC METABOLIC PANEL - Abnormal; Notable for the following:    Glucose, Bld 390 (*)    BUN 4 (*)    GFR calc non Af Amer 87 (*)    All other components within normal limits  PRO B NATRIURETIC PEPTIDE - Abnormal; Notable for the following:    Pro B Natriuretic peptide (BNP) 151.5 (*)  All other components within normal limits  GLUCOSE, CAPILLARY - Abnormal; Notable for the following:    Glucose-Capillary 319 (*)    All other components within normal limits   COMPREHENSIVE METABOLIC PANEL - Abnormal; Notable for the following:    Potassium 3.1 (*)    Glucose, Bld 297 (*)    BUN 4 (*)    GFR calc non Af Amer 89 (*)    All other components within normal limits  HEMOGLOBIN A1C - Abnormal; Notable for the following:    Hemoglobin A1C 13.1 (*)    Mean Plasma Glucose 329 (*)    All other components within normal limits  LIPID PANEL - Abnormal; Notable for the following:    LDL Cholesterol 132 (*)    All other components within normal limits  TROPONIN I - Abnormal; Notable for the following:    Troponin I 1.55 (*)    All other components within normal limits  TROPONIN I - Abnormal; Notable for the following:    Troponin I 3.95 (*)    All other components within normal limits  CBC - Abnormal; Notable for the following:    WBC 10.8 (*)    RBC 5.12 (*)    Hemoglobin 15.3 (*)    All other components within normal limits  GLUCOSE, CAPILLARY - Abnormal; Notable for the following:    Glucose-Capillary 205 (*)    All other components within normal limits  PROTIME-INR - Abnormal; Notable for the following:    Prothrombin Time 15.3 (*)    All other components within normal limits  APTT - Abnormal; Notable for the following:    aPTT 66 (*)    All other components within normal limits  HEPARIN LEVEL (UNFRACTIONATED) - Abnormal; Notable for the following:    Heparin Unfractionated 0.23 (*)    All other components within normal limits  GLUCOSE, CAPILLARY - Abnormal; Notable for the following:    Glucose-Capillary 191 (*)    All other components within normal limits  HEPARIN LEVEL (UNFRACTIONATED) - Abnormal; Notable for the following:    Heparin Unfractionated <0.10 (*)    All other components within normal limits  BASIC METABOLIC PANEL - Abnormal; Notable for the following:    Potassium 2.9 (*)    Glucose, Bld 186 (*)    BUN 5 (*)    GFR calc non Af Amer 89 (*)    All other components within normal limits  GLUCOSE, CAPILLARY - Abnormal;  Notable for the following:    Glucose-Capillary 191 (*)    All other components within normal limits  GLUCOSE, CAPILLARY - Abnormal; Notable for the following:    Glucose-Capillary 293 (*)    All other components within normal limits  GLUCOSE, CAPILLARY - Abnormal; Notable for the following:    Glucose-Capillary 194 (*)    All other components within normal limits  GLUCOSE, CAPILLARY - Abnormal; Notable for the following:    Glucose-Capillary 255 (*)    All other components within normal limits  GLUCOSE, CAPILLARY - Abnormal; Notable for the following:    Glucose-Capillary 140 (*)    All other components within normal limits  GLUCOSE, CAPILLARY - Abnormal; Notable for the following:    Glucose-Capillary 273 (*)    All other components within normal limits  GLUCOSE, CAPILLARY - Abnormal; Notable for the following:    Glucose-Capillary 178 (*)    All other components within normal limits  MRSA PCR SCREENING  TROPONIN I  TSH  TROPONIN I  HEPARIN  LEVEL (UNFRACTIONATED)  CBC  CBC  POCT I-STAT TROPONIN I  POCT ACTIVATED CLOTTING TIME   Imaging Review No results found.  EKG Interpretation    Date/Time:  Sunday October 14 2013 18:42:20 EST Ventricular Rate:  73 PR Interval:  151 QRS Duration: 94 QT Interval:  433 QTC Calculation: 477 R Axis:   4 Text Interpretation:  Sinus rhythm LAE, consider biatrial enlargement Nonspecific ST abnormality No significant change since last tracing Confirmed by POLLINA  MD, CHRISTOPHER ([id number]) on 10/14/2013 6:46:32 PM            MDM   1. Chest pain    Patient presents to the ER for evaluation of chest pain. Patient's pain was the day yesterday and then resolve, recurred again today. She does have a history of coronary artery disease. Initial EKG showed nonspecific changes but no change from prior. Troponin was negative. Patient underwent CT angiogram rule out aortic abnormality as well as PE. None seen. Patient admitted for further  management.    Gilda Crease, MD 10/19/13 [phone]

## 2013-10-14 NOTE — ED Notes (Signed)
Cardiology at bedside.

## 2013-10-14 NOTE — H&P (Signed)
History and Physical   Admit date: 10/14/2013 Name:  Caroline Medina Medical record number: [id number] DOB/Age:  [birth year]  72 y.o. female  Referring Physician:   [facility]  Primary Cardiologist:  Dr. Verdis Prime  Primary Physician:   Dr. Della Goo  Chief complaint/reason for admission: Chest pain  HPI:  This 72 year old black female presented to the emergency room with prolonged chest discomfort. She began to have discomfort yesterday described as upper epigastric and lower mid sternal with radiation through to the back that lasted for multiple hours and was present when she went to bed last evening. She woke this morning without discomfort but had recurrence of pain it was constant and described as a sharp type chest pain with radiation through to her back. The discomfort reoccurred around 10:30 this morning and she presented to the emergency room around 6 PM tonight. She had some nausea and vomiting was treated with Zofran and received aspirin as well as nitroglycerin without relief. She had a CT angiogram that showed no evidence of pulmonary emboli in review of this study with the radiologist also did not show any gross evidence of dissection. She reported that she did not take her blood pressure medicines the day of admission. She continues to have chest discomfort radiating through to her back. 2 serial EKGs show nonspecific changes but were not evolutionary and initial troponin was negative.  She has a history of coronary artery disease and had stenting in 2 places of the right coronary artery in 2008 by Dr. Katrinka Blazing. She continues to smoke cigarettes. She also is diabetic and has hypertension.    Past Medical History  Diagnosis Date  . Coronary artery disease   . Diabetes mellitus   . Hyperlipidemia   . Kidney stones   . Hypertension   . GERD (gastroesophageal reflux disease)   . Asthma 10/14/2013  . Adrenal adenoma (bilateral) 10/14/2013  . Obesity (BMI  30-39.9) 10/14/2013    Past Surgical History  Procedure Laterality Date  . Coronary stent placement    . Kidney stone surgery    . Abdominal hysterectomy    . Knee arthroscopy Right   . Carpal tunnel release    . Bunionectomy    . Cataract extraction     Allergies: is allergic to penicillins.   Medications: Prior to Admission medications   Medication Sig Start Date End Date Taking? Authorizing Provider  albuterol (PROVENTIL HFA;VENTOLIN HFA) 108 (90 BASE) MCG/ACT inhaler Inhale 2 puffs into the lungs every 6 (six) hours as needed.      Historical Provider, MD  ALPRAZolam Prudy Feeler) 0.25 MG tablet Take by mouth 2 (two) times daily.      Historical Provider, MD  amitriptyline (ELAVIL) 25 MG tablet Take 1 tablet by mouth Daily. 04/11/12   Historical Provider, MD  aspirin 81 MG tablet Take 81 mg by mouth daily.      Historical Provider, MD  cloNIDine (CATAPRES) 0.1 MG tablet Take by mouth 2 (two) times daily.      Historical Provider, MD  diltiazem (DILACOR XR) 240 MG 24 hr capsule  09/26/13   Historical Provider, MD  ferrous sulfate 325 (65 FE) MG tablet Take 325 mg by mouth daily with breakfast.    Historical Provider, MD  fish oil-omega-3 fatty acids 1000 MG capsule Take 2 g by mouth daily.    Historical Provider, MD  Fluticasone-Salmeterol (ADVAIR) 100-50 MCG/DOSE AEPB Inhale 1 puff into the lungs every 12 (twelve) hours.  Historical Provider, MD  FLUVIRIN INJ injection  09/05/13   Historical Provider, MD  gabapentin (NEURONTIN) 300 MG capsule  09/26/13   Historical Provider, MD  HYDROcodone-acetaminophen (VICODIN) 5-500 MG per tablet Take 1 tablet by mouth every 6 (six) hours as needed.      Historical Provider, MD  losartan (COZAAR) 100 MG tablet  09/26/13   Historical Provider, MD  metFORMIN (GLUCOPHAGE) 1000 MG tablet Take 1,000 mg by mouth 2 (two) times daily with a meal.      Historical Provider, MD  montelukast (SINGULAIR) 10 MG tablet Take 10 mg by mouth at bedtime.       Historical Provider, MD  Multiple Vitamins-Minerals (MULTIVITAMIN WITH MINERALS) tablet Take 1 tablet by mouth daily.    Historical Provider, MD  omeprazole (PRILOSEC) 20 MG capsule Take 1 capsule by mouth Daily. 04/11/12   Historical Provider, MD  rosuvastatin (CRESTOR) 10 MG tablet Take by mouth daily.      Historical Provider, MD  simvastatin (ZOCOR) 10 MG tablet  08/31/13   Historical Provider, MD  sitaGLIPtin (JANUVIA) 25 MG tablet Take 25 mg by mouth daily.      Historical Provider, MD  valsartan (DIOVAN) 160 MG tablet Take by mouth daily.      Historical Provider, MD    Family History:  Family Status  Relation Status Death Age  . Father Deceased     kidney problems  . Mother Deceased 47    renal failure  . Sister Alive     diabetes   Social History:   reports that she has been smoking Cigarettes.  She has a 50 pack-year smoking history. She has never used smokeless tobacco. She reports that she drinks alcohol. She reports that she does not use illicit drugs.   History   Social History Narrative   Widow           Review of Systems:  She has lost a lot of weight this year but is vague as to how much it could be. She states that she is weighed as much as 300 pounds but currently weighs only 210. She continues to smoke. She denies PND or orthopnea and normally is quite active without exertional pain. She states that the previous pain when she had a myocardial infarction was arm pain. She is treated for possible neuropathic pain with gabapentin.  Other than as noted above, the remainder of the review of systems is normal  Physical Exam: BP 163/96  Pulse 77  Temp(Src) 97.9 F (36.6 C) (Oral)  Resp 22  Ht 5\' 2"  (1.575 m)  Wt 92.987 kg (205 lb)  BMI 37.49 kg/m2  SpO2 98%  General appearance: Elderly appearing black female currently in no acute distress but does complain of some mild chest pain Head: Normocephalic, without obvious abnormality, atraumatic Eyes:  conjunctivae/corneas clear. Bilateral arcus senilis PERRL, EOM's intact. Fundi not examined  Neck: no adenopathy, no carotid bruit, no JVD and supple, symmetrical, trachea midline Lungs: clear to auscultation bilaterally Heart: Regular rate and rhythm, normal S1-S2, no S3, 1/6 systolic murmur Abdomen: Soft, mild epigastric tenderness, no right upper quadrant tenderness, no rebound, bowel sounds present Pelvic: deferred Extremities: extremities normal, atraumatic, no cyanosis or edema Pulses: 2+ and symmetric Skin: Mild hirsutism present Neurologic: Grossly normal  Labs: CBC  Recent Labs  10/14/13 1903  WBC 10.2  RBC 5.34*  HGB 15.9*  HCT 46.4*  PLT 237  MCV 86.9  MCH 29.8  MCHC 34.3  RDW 14.1  LYMPHSABS 1.1  MONOABS 0.4  EOSABS 0.1  BASOSABS 0.0   CMP   Recent Labs  10/14/13 1903  NA 138  K 3.6  CL 98  CO2 29  GLUCOSE 390*  BUN 4*  CREATININE 0.65  CALCIUM 9.7  GFRNONAA 87*  GFRAA >90   BNP (last 3 results)  Recent Labs  10/14/13 1903  PROBNP 151.5*   Cardiac Panel (last 3 results)  Recent Labs  10/14/13 1903  TROPONINI <0.30   EKG: Sinus rhythm, ST depression in the lateral leads of 1 mm.  Radiology: Cardiomegaly, calcified aorta   IMPRESSIONS: 1. Prolonged chest discomfort with nonspecific ST changes and mild abdominal tenderness with negative cardiac enzymes initially-possibilities could include ischemia, gallbladder disease or gastrointestinal in nature 2. Coronary artery disease with previous stent placement to the right coronary artery 3. Hypertension 4. Tobacco abuse ongoing 5. Diabetes mellitus 6. Abnormal weight loss according to patient  PLAN: She's had prolonged chest discomfort with nonspecific changes on the EKG and initial negative enzymes. Her enzymes be cycled and she will be continued on heparin. Obtain ultrasound to further look at the gallbladder. Obtain liver enzymes and amylase. Treated intensively with proton pump  inhibitors and keep n.p.o. after midnight for further testing per Dr. Katrinka Blazing. Discomfort is somewhat atypical for unstable angina.  Signed: Darden Palmer MD [facility]  10/14/2013, 10:48 PM

## 2013-10-14 NOTE — ED Notes (Signed)
Patient returned from CT

## 2013-10-14 NOTE — ED Notes (Signed)
Patient transported to CT 

## 2013-10-14 NOTE — ED Provider Notes (Signed)
Pt received from Dr. Blinda Leatherwood.  Pt w/ h/o CAD w/ 2 drug-eluting to RCA presented to ED w/ CP and SOB x 2 days.  EKG non-ischemic and troponin negative.  CTA chest has just resulted and is unremarkable.  Results discussed w/ pt and her son.  She continues to have severe pain in the center of her chest and appears very restless.  No relief w/ 4mg  IV morphine.  Nursing staff reports that she appeared comfortable before leaving for CT. Repeat EKG and delta troponin ordered and pending.  ntg drip started. Dr. Donnie Aho consulted for admission.  VS currently stable.  9:29 PM     Otilio Miu, PA-C 10/15/13 1529

## 2013-10-14 NOTE — ED Notes (Signed)
Increased nitro gtt to .

## 2013-10-15 ENCOUNTER — Observation Stay (HOSPITAL_COMMUNITY): Payer: Medicare Other

## 2013-10-15 ENCOUNTER — Encounter (HOSPITAL_COMMUNITY): Payer: Self-pay | Admitting: *Deleted

## 2013-10-15 ENCOUNTER — Encounter (HOSPITAL_COMMUNITY)
Admission: EM | Disposition: A | Discharge status: Home / Self Care | Payer: Self-pay | Source: Home / Self Care | Attending: Interventional Cardiology

## 2013-10-15 DIAGNOSIS — I214 Non-ST elevation (NSTEMI) myocardial infarction: Secondary | ICD-10-CM

## 2013-10-15 DIAGNOSIS — I2510 Atherosclerotic heart disease of native coronary artery without angina pectoris: Secondary | ICD-10-CM

## 2013-10-15 HISTORY — PX: LEFT HEART CATHETERIZATION WITH CORONARY ANGIOGRAM: SHX5451

## 2013-10-15 HISTORY — PX: PERCUTANEOUS CORONARY STENT INTERVENTION (PCI-S): SHX5485

## 2013-10-15 LAB — HEMOGLOBIN A1C
Hgb A1c MFr Bld: 13.1 % — ABNORMAL HIGH (ref <5.7)
Mean Plasma Glucose: 329 mg/dL — ABNORMAL HIGH (ref <117)

## 2013-10-15 LAB — COMPREHENSIVE METABOLIC PANEL
ALT: 8 U/L (ref 0–35)
Albumin: 3.5 g/dL (ref 3.5–5.2)
Alkaline Phosphatase: 74 U/L (ref 39–117)
BUN: 4 mg/dL — ABNORMAL LOW (ref 6–23)
Calcium: 9.1 mg/dL (ref 8.4–10.5)
Creatinine, Ser: 0.6 mg/dL (ref 0.50–1.10)
GFR calc Af Amer: >90 mL/min (ref >90)
Glucose, Bld: 297 mg/dL — ABNORMAL HIGH (ref 70–99)
Potassium: 3.1 mEq/L — ABNORMAL LOW (ref 3.5–5.1)
Total Protein: 7.6 g/dL (ref 6.0–8.3)

## 2013-10-15 LAB — LIPID PANEL
Cholesterol: 188 mg/dL (ref 0–200)
HDL: 47 mg/dL (ref >39)
LDL Cholesterol: 132 mg/dL — ABNORMAL HIGH (ref 0–99)
Triglycerides: 43 mg/dL (ref <150)
VLDL: 9 mg/dL (ref 0–40)

## 2013-10-15 LAB — CBC
Hemoglobin: 15.3 g/dL — ABNORMAL HIGH (ref 12.0–15.0)
MCHC: 34.3 g/dL (ref 30.0–36.0)
MCV: 87.1 fL (ref 78.0–100.0)
RBC: 5.12 MIL/uL — ABNORMAL HIGH (ref 3.87–5.11)
RDW: 13.9 % (ref 11.5–15.5)
WBC: 10.8 10*3/uL — ABNORMAL HIGH (ref 4.0–10.5)

## 2013-10-15 LAB — PROTIME-INR: INR: 1.24 (ref 0.00–1.49)

## 2013-10-15 LAB — GLUCOSE, CAPILLARY
Glucose-Capillary: 205 mg/dL — ABNORMAL HIGH (ref 70–99)
Glucose-Capillary: 191 mg/dL — ABNORMAL HIGH (ref 70–99)
Glucose-Capillary: 293 mg/dL — ABNORMAL HIGH (ref 70–99)

## 2013-10-15 LAB — TROPONIN I: Troponin I: 3.95 ng/mL (ref <0.30)

## 2013-10-15 LAB — HEPARIN LEVEL (UNFRACTIONATED)
Heparin Unfractionated: 0.5 IU/mL (ref 0.30–0.70)
Heparin Unfractionated: 0.23 IU/mL — ABNORMAL LOW (ref 0.30–0.70)

## 2013-10-15 LAB — APTT: aPTT: 66 seconds — ABNORMAL HIGH (ref 24–37)

## 2013-10-15 LAB — MRSA PCR SCREENING: MRSA by PCR: NEGATIVE

## 2013-10-15 SURGERY — LEFT HEART CATHETERIZATION WITH CORONARY ANGIOGRAM
Anesthesia: LOCAL

## 2013-10-15 MED ORDER — STUDY - INVESTIGATIONAL DRUG SIMPLE RECORD
7.5000 mg | Freq: Two times a day (BID) | Status: DC
  Administered 2013-10-15 – 2013-10-17 (×5): 7.5 mg via ORAL
  Filled 2013-10-15 (×2): qty 7.5

## 2013-10-15 MED ORDER — DIAZEPAM 5 MG PO TABS
5.0000 mg | ORAL_TABLET | ORAL | Status: AC
  Administered 2013-10-15: 5 mg via ORAL
  Filled 2013-10-15: qty 1

## 2013-10-15 MED ORDER — TICAGRELOR 90 MG PO TABS
90.0000 mg | ORAL_TABLET | Freq: Two times a day (BID) | ORAL | Status: DC
  Administered 2013-10-15 – 2013-10-17 (×4): 90 mg via ORAL
  Filled 2013-10-15 (×6): qty 1

## 2013-10-15 MED ORDER — SODIUM CHLORIDE 0.9 % IV SOLN: 250.0000 mL | INTRAVENOUS | Status: DC | PRN

## 2013-10-15 MED ORDER — BIVALIRUDIN 250 MG IV SOLR
INTRAVENOUS | Status: AC
  Filled 2013-10-15: qty 250

## 2013-10-15 MED ORDER — SODIUM CHLORIDE 0.9 % IJ SOLN
3.0000 mL | Freq: Two times a day (BID) | INTRAMUSCULAR | Status: DC
  Administered 2013-10-15 – 2013-10-16 (×2): 3 mL via INTRAVENOUS

## 2013-10-15 MED ORDER — SODIUM CHLORIDE 0.9 % IV SOLN
250.0000 mL | INTRAVENOUS | Status: DC | PRN
  Administered 2013-10-16: 250 mL via INTRAVENOUS

## 2013-10-15 MED ORDER — TICAGRELOR 90 MG PO TABS
ORAL_TABLET | ORAL | Status: AC
  Filled 2013-10-15: qty 1

## 2013-10-15 MED ORDER — LIDOCAINE HCL (PF) 1 % IJ SOLN
INTRAMUSCULAR | Status: AC
  Filled 2013-10-15: qty 30

## 2013-10-15 MED ORDER — HEPARIN (PORCINE) IN NACL 2-0.9 UNIT/ML-% IJ SOLN
INTRAMUSCULAR | Status: AC
  Filled 2013-10-15: qty 1000

## 2013-10-15 MED ORDER — NITROGLYCERIN 0.2 MG/ML ON CALL CATH LAB
INTRAVENOUS | Status: AC
  Filled 2013-10-15: qty 1

## 2013-10-15 MED ORDER — SODIUM CHLORIDE 0.9 % IJ SOLN: 3.0000 mL | INTRAMUSCULAR | Status: DC | PRN

## 2013-10-15 MED ORDER — SODIUM CHLORIDE 0.9 % IJ SOLN
3.0000 mL | Freq: Two times a day (BID) | INTRAMUSCULAR | Status: DC
  Administered 2013-10-16: 3 mL via INTRAVENOUS

## 2013-10-15 MED ORDER — SODIUM CHLORIDE 0.9 % IV SOLN
1.0000 mL/kg/h | INTRAVENOUS | Status: DC
  Administered 2013-10-15: 1 mL/kg/h via INTRAVENOUS

## 2013-10-15 MED ORDER — ASPIRIN 81 MG PO CHEW
81.0000 mg | CHEWABLE_TABLET | ORAL | Status: AC
  Administered 2013-10-15: 81 mg via ORAL
  Filled 2013-10-15: qty 1

## 2013-10-15 MED ORDER — SODIUM CHLORIDE 0.9 % IV SOLN
INTRAVENOUS | Status: DC
  Administered 2013-10-15: 13:00:00 via INTRAVENOUS

## 2013-10-15 MED ORDER — MIDAZOLAM HCL 2 MG/2ML IJ SOLN
INTRAMUSCULAR | Status: AC
  Filled 2013-10-15: qty 2

## 2013-10-15 MED ORDER — FENTANYL CITRATE 0.05 MG/ML IJ SOLN
INTRAMUSCULAR | Status: AC
  Filled 2013-10-15: qty 2

## 2013-10-15 NOTE — Interval H&P Note (Signed)
Cath Lab Visit (complete for each Cath Lab visit)  Clinical Evaluation Leading to the Procedure:   ACS: yes  Non-ACS:    Anginal Classification: CCS III  Anti-ischemic medical therapy: No Therapy  Non-Invasive Test Results: No non-invasive testing performed  Prior CABG: No previous CABG      History and Physical Interval Note:  10/15/2013 3:34 PM  Caroline Medina  has presented today for surgery, with the diagnosis of cp  The various methods of treatment have been discussed with the patient and family. After consideration of risks, benefits and other options for treatment, the patient has consented to  Procedure(s): LEFT HEART CATHETERIZATION WITH CORONARY ANGIOGRAM (N/A) as a surgical intervention .  The patient's history has been reviewed, patient examined, no change in status, stable for surgery.  I have reviewed the patient's chart and labs.  Questions were answered to the patient's satisfaction.     Marcial Pless R

## 2013-10-15 NOTE — Progress Notes (Signed)
       Patient Name: Caroline Medina Date of Encounter: 10/15/2013    SUBJECTIVE:Patient had prolonged somewhat atypical chest pain during the day and night leading to admission. The Trop I is elevated and currently not having pain.  TELEMETRY:  NSR Filed Vitals:   10/15/13 0300 10/15/13 0400 10/15/13 0500 10/15/13 0600  BP: 152/77 130/64 134/68 158/70  Pulse: 74 62 60 49  Temp:  98 F (36.7 C)    TempSrc:  Oral    Resp:      Height:      Weight:      SpO2: 95% 94% 93% 93%    Intake/Output Summary (Last 24 hours) at 10/15/13 0817 Last data filed at 10/15/13 0600  Gross per 24 hour  Intake   75.3 ml  Output      0 ml  Net   75.3 ml    LABS: Basic Metabolic Panel:  Recent Labs  32/44/01 0050 10/14/13 1903  NA 137 138  K 3.1* 3.6  CL 98 98  CO2 30 29  GLUCOSE 297* 390*  BUN 4* 4*  CREATININE 0.60 0.65  CALCIUM 9.1 9.7   CBC:  Recent Labs  10/14/13 1903 10/15/13 0050  WBC 10.2 10.8*  NEUTROABS 8.7*  --   HGB 15.9* 15.3*  HCT 46.4* 44.6  MCV 86.9 87.1  PLT 237 230   Cardiac Enzymes:  Recent Labs  10/14/13 0050 10/14/13 1903 10/15/13 0605  TROPONINI <0.30 <0.30 1.55*    Hemoglobin A1C: No results found for this basename: HGBA1C,  in the last 72 hours Fasting Lipid Panel:  Recent Labs  10/15/13 0050  CHOL 188  HDL 47  LDLCALC 132*  TRIG 43  CHOLHDL 4.0    Radiology/Studies:  Unremarkable including CTA  Physical Exam: Blood pressure 158/70, pulse 49, temperature 98 F (36.7 C), temperature source Oral, resp. rate 9, height 5\' 2"  (1.575 m), weight 205 lb (92.987 kg), SpO2 93.00%. Weight change:    Chest clear S4 gallop.  ASSESSMENT:  1. 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 Stress CM also a consideration. Has h/o RCA stent. 2. Obese and multiple other risk factors.  Plan:  1. Needs cath today  Selinda Eon 10/15/2013, 8:17 AM

## 2013-10-15 NOTE — Progress Notes (Signed)
Rx bottle of Ambien found in patient's bed. Patient denies taking any while in hospital. Patient instructed that home medications need to be sent to pharmacy's vault for safety purposes. Medication counted with patient and RN present. Medication receipt signed by patient and RN and placed in patient's chart.

## 2013-10-15 NOTE — Progress Notes (Signed)
UR completed 

## 2013-10-15 NOTE — Progress Notes (Signed)
Patient out of bed to bedside commode without assistance's present. RN to bedside. Found post cath right femoral site bleeding. Blood found on floor and patient's gown. Pt returned to bed. Direct pressure applied to site for approximately 20 minutes. Bleeding stopped/ New occlusive dressing applied. MD paged. Katie, PA returned paged. Instructed to keep patient on bed rest for the remainder of the night. Will continue to assess and monitor vascular site. Patient remained hemodynamically stable.

## 2013-10-15 NOTE — Interval H&P Note (Signed)
History and Physical Interval Note:  10/15/2013 3:29 PM  KADIA ABAYA  has presented today for surgery, with the diagnosis of cp  The various methods of treatment have been discussed with the patient and family. After consideration of risks, benefits and other options for treatment, the patient has consented to  Procedure(s): LEFT HEART CATHETERIZATION WITH CORONARY ANGIOGRAM (N/A) as a surgical intervention .  The patient's history has been reviewed, patient examined, no change in status, stable for surgery.  I have reviewed the patient's chart and labs.  Questions were answered to the patient's satisfaction.     TURNER,TRACI R

## 2013-10-15 NOTE — Progress Notes (Addendum)
ANTICOAGULATION CONSULT NOTE - Follow up Consult  Pharmacy Consult for heparin Indication: chest pain/ACS  Allergies  Allergen Reactions  . Penicillins Rash    Patient Measurements: Height: 5\' 2"  (157.5 cm) Weight: 205 lb (92.987 kg) IBW/kg (Calculated) : 50.1 Heparin Dosing Weight: 75kg  Vital Signs: Temp: 98.2 F (36.8 C) (12/22 0916) Temp src: Oral (12/22 0916) BP: 160/78 mmHg (12/22 0916) Pulse Rate: 51 (12/22 0916)  Labs:  Recent Labs  10/14/13 0050 10/14/13 1903 10/15/13 0050 10/15/13 0605 10/15/13 0725  HGB  --  15.9* 15.3*  --   --   HCT  --  46.4* 44.6  --   --   PLT  --  237 230  --   --   HEPARINUNFRC  --   --   --   --  0.50  CREATININE 0.60 0.65  --   --   --   TROPONINI <0.30 <0.30  --  1.55*  --     Estimated Creatinine Clearance: 67.5 ml/min (by C-G formula based on Cr of 0.65).   Medical History: Past Medical History  Diagnosis Date  . Coronary artery disease   . Diabetes mellitus   . Hyperlipidemia   . Kidney stones   . Hypertension   . GERD (gastroesophageal reflux disease)   . Asthma 10/14/2013  . Adrenal adenoma (bilateral) 10/14/2013  . Obesity (BMI 30-39.9) 10/14/2013    Assessment: 72yo female c/o prolonged chest discomfort and mild abdominal tenderness, found w/ nonspecific ST changes and negative initial cardiac enzymes started on heparin.  First HL is therapeutic at 0.5.  Will order another HL in 8 hrs for confirmatory.  Hgb slight elevated, hct wnl, and plt normal.  No bleeding noted per notes.  Goal of Therapy:  Heparin level 0.3-0.7 units/ml Monitor platelets by anticoagulation protocol: Yes   Plan:  -Continue heparin gtt at 1000 units/hr and monitor heparin levels in 8 hours at 1430 and CBC daily -possible cath today  Anabel Bene, PharmD Clinical Pharmacist Resident Pager: [phone]  10/15/2013,10:20 AM  ________  1430 HL is subtherapeutic at 0.23, and pt was taken to cath today around 1530 (heparin was stopped  around 1500).     Pt is still in Cath.    Plan: F/up restart of Heparin after cath

## 2013-10-15 NOTE — Progress Notes (Signed)
MD paged to notify of positive troponin 1.55; awaiting call back.

## 2013-10-15 NOTE — Progress Notes (Signed)
Inpatient Diabetes Program Recommendations  AACE/ADA: New Consensus Statement on Inpatient Glycemic Control (2013)  Target Ranges:  Prepandial:   less than 140 mg/dL      Peak postprandial:   less than 180 mg/dL (1-2 hours)      Critically ill patients:  140 - 180 mg/dL  Results for Caroline Medina, Caroline Medina (MRN [id number]) as of 10/15/2013 10:04  Ref. Range 10/14/2013 22:36 10/15/2013 09:21  Glucose-Capillary Latest Range: 70-99 mg/dL 409 (H) 811 (H)   Inpatient Diabetes Program Recommendations Insulin - Basal: add Lantus 10 units at HS  HgbA1C: pending result Thank you  Piedad Climes BSN, RN,CDE Inpatient Diabetes Coordinator [phone] (team pager)

## 2013-10-15 NOTE — CV Procedure (Signed)
   PROCEDURE:  Left heart catheterization with selective coronary angiography, left ventriculogram.  INDICATIONS:  NSTEMI  The risks, benefits, and details of the procedure were explained to the patient.  The patient verbalized understanding and wanted to proceed.  Informed written consent was obtained.  PROCEDURE TECHNIQUE:  After Xylocaine anesthesia a 50F sheath was placed in the right femoral artery with a single anterior needle wall stick.   Left coronary angiography was done using a Judkins L4 guide catheter.  Right coronary angiography was done using a Judkins R4 guide catheter.  Left ventriculography was done using a pigtail catheter.    CONTRAST:  Total of 80 cc.  COMPLICATIONS:  None.    HEMODYNAMICS:  Aortic pressure was 154/63mmHg; LV pressure was 147/44mmHg; LVEDP .  There was no gradient between the left ventricle and aorta.    ANGIOGRAPHIC DATA:   The left main coronary artery is widely patent and trifurcates into an LAD, ramus and left circumflex arteries.  The left anterior descending artery is widely patent in its proximal portion.  It gives rise to a small first diagonal which is patent.  At the takeoff of the diagonal in the LAO cranial views the LAD has a hazy appearance the concern for possible stenosis although in other views there does not appear to be an obstructive lesion. The LAD then gives rise to a second diagonal which is patent.  The ongoing LAD traverses to the apex and is patent.  The Ramus is a large vessel which is widely patent.    The left circumflex artery is patent at the ostium but has a high grade stenosis of 80-90% in the proximal portion.  The ongoing left circ then gives rise to a large OM1 which is widely patent.    The right coronary artery is widely patent throughout its course.  There is a 20-30% narrowing just proximal to the first stent.  Both stents in the mid and distal RCA are widely patent and distally bifurcates into a PDA and PL  branches.  LEFT VENTRICULOGRAM:  Left ventricular angiogram was done in the 30 RAO projection and revealed normal left ventricular wall motion and systolic function with an estimated ejection fraction of 55%.  LVEDP was 18 mmHg.  IMPRESSIONS:  1. Normal left main coronary artery. 2. Hazy appearance of the proximal LAD at the takeoff of the first diagonal. 3. Normal Ramus artery and its branches. 4. Patent right coronary artery stents with 30% narrowing proximal to the first stent. 5.  80-90% stenosis of the proximal left circumflex. 5. Normal left ventricular systolic function.  LVEDP 18 mmHg.  Ejection fraction 55%.  RECOMMENDATION:   1.  Films reviewed with Dr. Excell Seltzer.  Will proceed with PCI of the left circumflex as well as get more images of the LAD to reassess hazy appearance of the prox LAD.

## 2013-10-15 NOTE — CV Procedure (Signed)
    CARDIAC CATH NOTE  Name: Caroline Medina MRN: [id number] DOB: [birth year]  Procedure: PTCA and stenting of the left circumflex  Indication: NSTEMI. 72 year-old diabetic woman with hx of CAD presenting with NSTEMI. She underwent diagnostic cath showed moderate LAD and RCA stenosis and severe proximal LCx stenosis felt to be her culprit vessel. After review of her films, Dr Mayford Knife and I agreed that PCI of the LCx and med Rx for her residual CAD is her best treatment option. She was loaded with Brilinta 180 mg on the table and her femoral sheath was upsized to 6 Fr.  Procedural Details:  Weight-based bivalirudin was given for anticoagulation. Once a therapeutic ACT was achieved, a 6 [place] XB 3.5 cm guide catheter was inserted.  A cougar coronary guidewire was used to cross the lesion.  The lesion was predilated with a 2.5x15 mm balloon.  The lesion was then stented with a 2.75x16 mm Promus Premier drug-eluting stent.  The stent was postdilated with a 3.0 mm noncompliant balloon.  Following PCI, there was 0% residual stenosis and TIMI-3 flow. Final angiography confirmed an excellent result. The patient tolerated the procedure well. There were no immediate procedural complications. Femoral hemostasis was achieved with a Perclose device. The patient was transferred to the post catheterization recovery area for further monitoring.  Lesion Data: Vessel: LCx/proximal Percent stenosis (pre): 95 TIMI-flow (pre):  3 Stent:  2.75x16 mm Promus DES Percent stenosis (post): 0 TIMI-flow (post): 3  Conclusions: Successful PCI of the Left Circumflex with a DES platform  Recommendations: ASA/Brilinta uninterrupted x 12 months, aggressive med Rx.  Tonny Bollman 10/15/2013, 5:29 PM

## 2013-10-15 NOTE — Progress Notes (Signed)
INITIAL NUTRITION ASSESSMENT  DOCUMENTATION CODES Per approved criteria  -Obesity Unspecified   INTERVENTION: 1.  Modify diet; resume PO diet once medically appropriate per MD discretion. 2.  Supplements; Glucerna BID once diet advanced.   NUTRITION DIAGNOSIS: Inadequate oral intake related to poor appetite, decreased access to food as evidenced by pt report.   Monitor:  1.  Food/Beverage; pt meeting >/=90% estimated needs with tolerance. 2.  Wt/wt change; monitor trends  Reason for Assessment: MST  72 y.o. female  Admitting Dx: NSTEMI (non-ST elevated myocardial infarction)  ASSESSMENT: Pt admitted with chest pain.  RD met with pt who reports wt loss over the past 1 year.  Pt states that her usual wt is 275 lbs which she last weighed ~1 yr ago. Per chart review, pt weighed 225 lbs in summer of 2013, and 240 lbs in 2009.   Pt reports poor appetite and decreased access to food.  She reports she lives at home with her son and money is tight.  She feels she has to chose between food and medication and is sometimes unable to afford both or either choice.  She reports not being able to buy all meds needed each month.   RD discussed availability of food resources.  Pt states "i have a lot of other things on my mind, too."  Encouraged supplements at home if able to afford.  Pt states "I'm picky."  Height: Ht Readings from Last 1 Encounters:  10/14/13 5\' 2"  (1.575 m)    Weight: Wt Readings from Last 1 Encounters:  10/14/13 205 lb (92.987 kg)    Ideal Body Weight: 110 lbs  % Ideal Body Weight: 186%  Wt Readings from Last 10 Encounters:  10/14/13 205 lb (92.987 kg)  10/14/13 205 lb (92.987 kg)  05/03/12 227 lb (102.967 kg)  04/19/12 227 lb 1.6 oz (103.012 kg)  06/04/08 243 lb 2.1 oz (110.284 kg)    Usual Body Weight: 230-240 lbs  % Usual Body Weight: 90%  BMI:  Body mass index is 37.49 kg/(m^2).  Estimated Nutritional Needs: Kcal: 1400-1600 Protein: 60-70g Fluid:  >1.5 L/day  Skin: intact  Diet Order: NPO  EDUCATION NEEDS: -Education needs addressed   Intake/Output Summary (Last 24 hours) at 10/15/13 1231 Last data filed at 10/15/13 1200  Gross per 24 hour  Intake  514.8 ml  Output    500 ml  Net   14.8 ml    Last BM: PTA  Labs:   Recent Labs Lab 10/14/13 0050 10/14/13 1903  NA 137 138  K 3.1* 3.6  CL 98 98  CO2 30 29  BUN 4* 4*  CREATININE 0.60 0.65  CALCIUM 9.1 9.7  GLUCOSE 297* 390*    CBG (last 3)   Recent Labs  10/14/13 2236 10/15/13 0921  GLUCAP 319* 205*    Scheduled Meds: . ALPRAZolam  0.25 mg Oral BID  . amitriptyline  25 mg Oral QHS  . aspirin  324 mg Oral Once  . aspirin EC  81 mg Oral Daily  . atorvastatin  20 mg Oral q1800  . cloNIDine  0.1 mg Oral BID  . diazepam  5 mg Oral On Call  . diltiazem  240 mg Oral Daily  . insulin aspart  0-15 Units Subcutaneous TID WC  . irbesartan  150 mg Oral Daily  . Latitude-TIMI study- Placebo / Losmapimod  (PI- Stuckey )  7.5 mg Oral BID  . linagliptin  5 mg Oral Daily  . mometasone-formoterol  2 puff  Inhalation BID  . montelukast  10 mg Oral QHS  . pantoprazole  40 mg Oral Q1200  . sodium chloride  3 mL Intravenous Q12H  . sodium chloride  3 mL Intravenous Q12H    Continuous Infusions: . sodium chloride 75 mL/hr at 10/15/13 1200  . heparin 1,000 Units/hr (10/15/13 1200)  . nitroGLYCERIN 40 mcg/min (10/15/13 1200)    Past Medical History  Diagnosis Date  . Coronary artery disease   . Diabetes mellitus   . Hyperlipidemia   . Kidney stones   . Hypertension   . GERD (gastroesophageal reflux disease)   . Asthma 10/14/2013  . Adrenal adenoma (bilateral) 10/14/2013  . Obesity (BMI 30-39.9) 10/14/2013    Past Surgical History  Procedure Laterality Date  . Coronary stent placement    . Kidney stone surgery    . Abdominal hysterectomy    . Knee arthroscopy Right   . Carpal tunnel release    . Bunionectomy    . Cataract extraction    . Rotator  cuff repair Left   . Bladder suspension      Loyce Dys, MS RD LDN Clinical Inpatient Dietitian Pager: [phone] Weekend/After hours pager: [phone]

## 2013-10-15 NOTE — Progress Notes (Signed)
CRITICAL VALUE ALERT  Critical value received:  Troponin  Date of notification:  10/15/13  Time of notification:  0720  Critical value read back:yes  Nurse who received alert:  Tish RN  MD notified (1st page):    Time of first page:  0720  MD notified (2nd page): Ward Givens  Time of second page: [phone]  Responding MD: Dr. Katrinka Blazing  Time MD responded:  [phone]

## 2013-10-15 NOTE — Interval H&P Note (Signed)
Cath Lab Visit (complete for each Cath Lab visit)  Clinical Evaluation Leading to the Procedure:   ACS: yes  Non-ACS:    Anginal Classification: CCS III  Anti-ischemic medical therapy: No Therapy  Non-Invasive Test Results: No non-invasive testing performed  Prior CABG: No previous CABG      History and Physical Interval Note:  10/15/2013 3:29 PM  Caroline Medina  has presented today for surgery, with the diagnosis of cp  The various methods of treatment have been discussed with the patient and family. After consideration of risks, benefits and other options for treatment, the patient has consented to  Procedure(s): LEFT HEART CATHETERIZATION WITH CORONARY ANGIOGRAM (N/A) as a surgical intervention .  The patient's history has been reviewed, patient examined, no change in status, stable for surgery.  I have reviewed the patient's chart and labs.  Questions were answered to the patient's satisfaction.     Carla Whilden R

## 2013-10-15 NOTE — Research (Signed)
Latitude Study Informed Consent   Subject Name: Caroline Medina  Subject met inclusion and exclusion criteria.  The informed consent form, study requirements and expectations were reviewed with the subject and questions and concerns were addressed prior to the signing of the consent form.  The subject verbalized understanding of the trail requirements.  The subject agreed to participate in the Latitude trial and signed the informed consent.  The informed consent was obtained prior to performance of any protocol-specific procedures for the subject.  A copy of the signed informed consent was given to the subject and a copy was placed in the subject's medical record.  [name] 10/15/2013, 10:45

## 2013-10-16 DIAGNOSIS — R079 Chest pain, unspecified: Secondary | ICD-10-CM

## 2013-10-16 LAB — BASIC METABOLIC PANEL
Calcium: 8.8 mg/dL (ref 8.4–10.5)
Chloride: 103 mEq/L (ref 96–112)
GFR calc non Af Amer: 89 mL/min — ABNORMAL LOW (ref >90)
Glucose, Bld: 186 mg/dL — ABNORMAL HIGH (ref 70–99)
Sodium: 141 mEq/L (ref 135–145)

## 2013-10-16 LAB — CBC
HCT: 38.9 % (ref 36.0–46.0)
MCH: 30 pg (ref 26.0–34.0)
MCHC: 34.2 g/dL (ref 30.0–36.0)
MCV: 87.6 fL (ref 78.0–100.0)
Platelets: 197 10*3/uL (ref 150–400)
RBC: 4.44 MIL/uL (ref 3.87–5.11)
RDW: 14.2 % (ref 11.5–15.5)

## 2013-10-16 MED ORDER — POTASSIUM CHLORIDE CRYS ER 20 MEQ PO TBCR
40.0000 meq | EXTENDED_RELEASE_TABLET | ORAL | Status: AC
  Administered 2013-10-16 (×2): 40 meq via ORAL
  Filled 2013-10-16 (×2): qty 2

## 2013-10-16 NOTE — Progress Notes (Signed)
CARDIAC REHAB PHASE I   PRE:  Rate/Rhythm: 48 SB  BP:  Supine: 100/60  Sitting:   Standing:    SaO2: 98 RA  MODE:  Ambulation: 430 ft   POST:  Rate/Rhythm: 56  BP:  Supine:   Sitting: 116/70  Standing:    SaO2: 99 RA 1450-1520 Assisted X 1 to ambulate. Gait steady. Pt walked 430 feet without c/o of cp or SOB. VS stable. Pt back to bed after walk. We discussed smoking cessation and her AIC. Pt states that she quit smoking after her first heart attack for 7 years but stared back. She also admits to sometime forgetting her medications. She was very groggy and keep falling asleep while we were talking. Will follow pt tomorrow.  Melina Copa RN 10/16/2013 3:18 PM

## 2013-10-16 NOTE — Progress Notes (Signed)
Patient ID: Caroline Medina, female   DOB: 18-Jan-1941, 72 y.o.   MRN: [id number]       Patient Name: Caroline Medina Date of Encounter: 10/16/2013    SUBJECTIVE: Had groin bleed last night. Was kept in bed all night.  TELEMETRY:  NSR: Filed Vitals:   10/16/13 0600 10/16/13 0700 10/16/13 0759 10/16/13 0800  BP: 140/77 148/81 154/67 154/67  Pulse: 54 51 56 58  Temp:   97.6 F (36.4 C)   TempSrc:   Oral   Resp:   14   Height:      Weight:      SpO2: 98% 98% 98% 98%    Intake/Output Summary (Last 24 hours) at 10/16/13 0854 Last data filed at 10/16/13 0800  Gross per 24 hour  Intake 1282.08 ml  Output    650 ml  Net 632.08 ml    LABS: Basic Metabolic Panel:  Recent Labs  82/95/62 1903 10/16/13 0446  NA 138 141  K 3.6 2.9*  CL 98 103  CO2 29 26  GLUCOSE 390* 186*  BUN 4* 5*  CREATININE 0.65 0.60  CALCIUM 9.7 8.8   CBC:  Recent Labs  10/14/13 1903 10/15/13 0050 10/16/13 0446  WBC 10.2 10.8* 10.1  NEUTROABS 8.7*  --   --   HGB 15.9* 15.3* 13.3  HCT 46.4* 44.6 38.9  MCV 86.9 87.1 87.6  PLT 237 230 197   Cardiac Enzymes:  Recent Labs  10/14/13 1903 10/15/13 0605 10/15/13 1117  TROPONINI <0.30 1.55* 3.95*   BNP: No components found with this basename: POCBNP,  Hemoglobin A1C:  Recent Labs  10/14/13 0050  HGBA1C 13.1*   Fasting Lipid Panel:  Recent Labs  10/15/13 0050  CHOL 188  HDL 47  LDLCALC 132*  TRIG 43  CHOLHDL 4.0   Digital imaged reviewed and some views suggest LAD/LM thrombus.  Physical Exam: Blood pressure 154/67, pulse 58, temperature 97.6 F (36.4 C), temperature source Oral, resp. rate 14, height 5\' 2"  (1.575 m), weight 205 lb (92.987 kg), SpO2 98.00%. Weight change:    Right groin ecchymosis but no firm hematoma Chest is clear.  ASSESSMENT:  1. NSTEMI now s/p CFX DES 2. Right groin bleed with no obvious hematoma  Plan:  1. D/C NTG 2. Transfer to telemetry 3. Phase 1 Rehab  Signed, Lesleigh Noe 10/16/2013, 8:54 AM

## 2013-10-17 LAB — CBC
HCT: 40.3 % (ref 36.0–46.0)
Hemoglobin: 13.5 g/dL (ref 12.0–15.0)
MCV: 87.4 fL (ref 78.0–100.0)
RBC: 4.61 MIL/uL (ref 3.87–5.11)
RDW: 14.2 % (ref 11.5–15.5)
WBC: 9.7 10*3/uL (ref 4.0–10.5)

## 2013-10-17 LAB — GLUCOSE, CAPILLARY: Glucose-Capillary: 178 mg/dL — ABNORMAL HIGH (ref 70–99)

## 2013-10-17 MED ORDER — NITROGLYCERIN 0.4 MG SL SUBL: 0.4000 mg | SUBLINGUAL_TABLET | SUBLINGUAL | Status: AC | PRN

## 2013-10-17 MED ORDER — AMITRIPTYLINE HCL 25 MG PO TABS: 25.0000 mg | ORAL_TABLET | Freq: Every day | ORAL | Status: AC

## 2013-10-17 MED ORDER — TICAGRELOR 90 MG PO TABS: 90.0000 mg | ORAL_TABLET | Freq: Two times a day (BID) | ORAL | Status: DC

## 2013-10-17 MED ORDER — STUDY - INVESTIGATIONAL DRUG SIMPLE RECORD: 7.5000 mg | Freq: Two times a day (BID) | Status: DC

## 2013-10-17 NOTE — Discharge Summary (Addendum)
Patient ID: Caroline Medina MRN: [id number] DOB/AGE: [birth year] 72 y.o.  Admit date: 10/14/2013 Discharge date: 10/17/2013  Primary Discharge Diagnosis: NSTE ACS (Non ST elevation acute coronary syndrome)  Secondary Discharge Diagnosis: Depression Hyperlipidemia Hypertension Tobacco use Diabetes  Patient Active Problem List   Diagnosis Date Noted  . NSTEMI (non-ST elevated myocardial infarction) 10/15/2013  . CAD (coronary artery disease) 10/14/2013  . Hyperlipidemia 10/14/2013  . Poorly controlled type 2 diabetes mellitus with neuropathy 10/14/2013  . Hypertensive heart disease 10/14/2013  . Obesity (BMI 30-39.9) 10/14/2013  . Tobacco abuse 10/14/2013  . Chest pain at rest 10/14/2013  . Adrenal adenoma (bilateral) 10/14/2013  . Asthma 10/14/2013  . GERD (gastroesophageal reflux disease)    Significant Diagnostic Studies:    Coronary Angiography:  IMPRESSIONS:  1. Normal left main coronary artery. 2. Hazy appearance of the proximal LAD at the takeoff of the first diagonal. 3. Normal Ramus artery and its branches. 4. Patent right coronary artery stents with 30% narrowing proximal to the first stent.       5.  80-90% stenosis of the proximal left circumflex.   6. Normal left ventricular systolic function. LVEDP 18 mmHg. Ejection fraction 55%.   Consults: Interventional Cardiology; Dr. Excell Seltzer Procedure: PTCA and stenting of the left circumflex  Indication: NSTEMI. 72 year-old diabetic woman with hx of CAD presenting with NSTEMI. She underwent diagnostic cath showed moderate LAD and RCA stenosis and severe proximal LCx stenosis felt to be her culprit vessel. After review of her films, Dr Mayford Knife and I agreed that PCI of the LCx and med Rx for her residual CAD is her best treatment option. She was loaded with Brilinta 180 mg on the table and her femoral sheath was upsized to 6 Fr.  Procedural Details: Weight-based bivalirudin was given for anticoagulation. Once a  therapeutic ACT was achieved, a 6 [place] XB 3.5 cm guide catheter was inserted. A cougar coronary guidewire was used to cross the lesion. The lesion was predilated with a 2.5x15 mm balloon. The lesion was then stented with a 2.75x16 mm Promus Premier drug-eluting stent. The stent was postdilated with a 3.0 mm noncompliant balloon. Following PCI, there was 0% residual stenosis and TIMI-3 flow. Final angiography confirmed an excellent result. The patient tolerated the procedure well. There were no immediate procedural complications. Femoral hemostasis was achieved with a Perclose device. The patient was transferred to the post catheterization recovery area for further monitoring.  Lesion Data:  Vessel: LCx/proximal  Percent stenosis (pre): 95  TIMI-flow (pre): 3  Stent: 2.75x16 mm Promus DES  Percent stenosis (post): 0  TIMI-flow (post): 3  Conclusions: Successful PCI of the Left Circumflex with a DES platform  Recommendations: ASA/Brilinta uninterrupted x 12 months, aggressive med Rx.  Tonny Bollman  10/15/2013, 5:29 PM  Hospital Course:  Admitted with prolonged atypical chest pain. Had troponin elevation without ECG changes. Underwent anti-coagulation therapy and eventual cath and PCI and noted above.  Post procedure had groin bleeding but no hematoma.  On the day of discharge was ambulating without chest pain, groin discomfort, or dyspnea.   Discharge Exam: Blood pressure 125/73, pulse 59, temperature 98.2 F (36.8 C), temperature source Oral, resp. rate 18, height 5\' 2"  (1.575 m), weight 205 lb (92.987 kg), SpO2 99.00%.    Right groin is unremarkable.  Labs:   Lab Results  Component Value Date   WBC 9.7 10/17/2013   HGB 13.5 10/17/2013   HCT 40.3 10/17/2013   MCV 87.4 10/17/2013  PLT 212 10/17/2013    Recent Labs Lab 10/14/13 0050  10/16/13 0446  NA 137  < > 141  K 3.1*  < > 2.9*  CL 98  < > 103  CO2 30  < > 26  BUN 4*  < > 5*  CREATININE 0.60  < > 0.60  CALCIUM 9.1   < > 8.8  PROT 7.6  --   --   BILITOT 0.3  --   --   ALKPHOS 74  --   --   ALT 8  --   --   AST 17  --   --   GLUCOSE 297*  < > 186*  < > = values in this interval not displayed. Lab Results  Component Value Date   TROPONINI 3.95* 10/15/2013    Lab Results  Component Value Date   CHOL 188 10/15/2013   Lab Results  Component Value Date   HDL 47 10/15/2013   Lab Results  Component Value Date   LDLCALC 132* 10/15/2013   Lab Results  Component Value Date   TRIG 43 10/15/2013   Lab Results  Component Value Date   CHOLHDL 4.0 10/15/2013   No results found for this basename: LDLDIRECT      Radiology: CXR with cardiomegaly but no CHF  EKG: Poor RWP but no acute changes or Q waves.  FOLLOW UP PLANS AND APPOINTMENTS      Future Appointments Provider Department Dept Phone   11/08/2013 3:45 PM Lesleigh Noe, MD [facility] [phone]       Medication List    STOP taking these medications       FLUVIRIN Inj injection  Generic drug:  influenza (>/= 3 years) inactive virus vaccine      TAKE these medications       ALPRAZolam 0.25 MG tablet  Commonly known as:  XANAX  Take by mouth 2 (two) times daily.     amitriptyline 25 MG tablet  Commonly known as:  ELAVIL  Take 1 tablet (25 mg total) by mouth at bedtime.     aspirin 81 MG tablet  Take 81 mg by mouth daily.     cloNIDine 0.1 MG tablet  Commonly known as:  CATAPRES  Take by mouth 2 (two) times daily.     diltiazem 240 MG 24 hr capsule  Commonly known as:  DILACOR XR     ferrous sulfate 325 (65 FE) MG tablet  Take 325 mg by mouth daily with breakfast.     fish oil-omega-3 fatty acids 1000 MG capsule  Take 2 g by mouth daily.     gabapentin 300 MG capsule  Commonly known as:  NEURONTIN  Take 300 mg by mouth 2 (two) times daily.     HYDROcodone-acetaminophen 5-500 MG per tablet  Commonly known as:  VICODIN  Take 1 tablet by mouth every 6 (six) hours as needed.      INVESTIGATIONAL DRUG SIMPLE RECORD  Take 7.5 mg by mouth 2 (two) times daily.     losartan 100 MG tablet  Commonly known as:  COZAAR     metFORMIN 1000 MG tablet  Commonly known as:  GLUCOPHAGE  Take 1,000 mg by mouth 2 (two) times daily with a meal.     multivitamin with minerals tablet  Take 1 tablet by mouth daily.     nitroGLYCERIN 0.4 MG SL tablet  Commonly known as:  NITROSTAT  Place 1 tablet (0.4 mg total) under  the tongue every 5 (five) minutes as needed for chest pain.     omeprazole 20 MG capsule  Commonly known as:  PRILOSEC  Take 1 capsule by mouth Daily.     simvastatin 10 MG tablet  Commonly known as:  ZOCOR     sitaGLIPtin 25 MG tablet  Commonly known as:  JANUVIA  Take 25 mg by mouth daily.     Ticagrelor 90 MG Tabs tablet  Commonly known as:  BRILINTA  Take 1 tablet (90 mg total) by mouth 2 (two) times daily.     valsartan 160 MG tablet  Commonly known as:  DIOVAN  Take by mouth daily.     zolpidem 10 MG tablet  Commonly known as:  AMBIEN  Take 10 mg by mouth at bedtime as needed for sleep.       Follow-up Information   Follow up In 4 weeks. (please call to arrange)       BRING ALL MEDICATIONS WITH YOU TO FOLLOW UP APPOINTMENTS  Time spent with patient to include physician time: 30 minutes Signed: Lesleigh Noe 10/17/2013, 9:22 AM

## 2013-10-17 NOTE — Care Management Note (Signed)
    Page 1 of 1   10/17/2013     9:20:58 AM   CARE MANAGEMENT NOTE 10/17/2013  Patient:  Caroline Medina, Caroline Medina   Account Number:  [id number]  Date Initiated:  10/17/2013  Documentation initiated by:  [facility]  Subjective/Objective Assessment:     Action/Plan:   Anticipated DC Date:  10/17/2013   Anticipated DC Plan:  HOME/SELF CARE      DC Planning Services  Medication Assistance      Choice offered to / List presented to:             Status of service:  Completed, signed off Medicare Important Message given?   (If response is "NO", the following Medicare IM given date fields will be blank) Date Medicare IM given:   Date Additional Medicare IM given:    Discharge Disposition:    Per UR Regulation:  Reviewed for med. necessity/level of care/duration of stay  If discussed at Long Length of Stay Meetings, dates discussed:    Comments:  Plan for discharge today with Brilinta.  Provided Brilinta cards for 30 days free and copay discounts.  Explained she will need to activate by calling number on card - actually showed number to call.  Nodded head to understanding. States she has to call the number to activate and she understands importance of getting mediation.  Udated physician.  Will writed for 30 day prescription and then regular prescription.

## 2013-10-17 NOTE — Progress Notes (Signed)
Cardiac Rehab [phone] Completed MI and stent education with pt. She kept falling asleep during education, but did well with teach back. We discussed smoking cessation. I gave her tips for quitting and coaching contact numbers. Pt seems committed to quitting. States that she want to do it cold [place], the was she quit before. Pt agrees to Outpt. CRP in [place], will send referral.

## 2013-10-17 NOTE — Progress Notes (Signed)
10/17/13 Patient given AVS discharge instructions medication list and paper prescriptions... Patient given brulinta card from CM, all questions were answered will discharge home as ordered. Chaniyah Jahr, Randall An  rN

## 2013-10-17 NOTE — Progress Notes (Signed)
Inpatient Diabetes Program Recommendations  AACE/ADA: New Consensus Statement on Inpatient Glycemic Control (2013)  Target Ranges:  Prepandial:   less than 140 mg/dL      Peak postprandial:   less than 180 mg/dL (1-2 hours)      Critically ill patients:  140 - 180 mg/dL  Results for CHINARA, HERTZBERG (MRN [id number]) as of 10/17/2013 11:16  Ref. Range 10/16/2013 08:02 10/16/2013 11:48 10/16/2013 16:29 10/16/2013 20:57 10/17/2013 06:18  Glucose-Capillary Latest Range: 70-99 mg/dL 782 (H) 956 (H) 213 (H) 140 (H) 178 (H)   Inpatient Diabetes Program Recommendations Insulin - Basal: . HgbA1C: 13.1 indicating suboptimal control at home This coordinator spoke with patient via telephone concerning elevated A1C.  Patient said that she was unaware of the results.  Explained results to patient and the need for further OP management of diabetes.  Patient reports that she takes her Metformin and Januvia as prescribed but does not monitor her CBGs often.  Encouraged patient to monitor CBGs at least BID before breakfast and at [facility] and take results to primary MD.  Patient does not have any questions/concerns at this time. Thank you  Piedad Climes BSN, RN,CDE Inpatient Diabetes Coordinator [phone] (team pager)

## 2013-10-19 NOTE — ED Provider Notes (Signed)
Medical screening examination/treatment/procedure(s) were performed by non-physician practitioner and as supervising physician I was immediately available for consultation/collaboration.  EKG Interpretation   None         Christopher J. Pollina, MD 10/19/13 0708 

## 2013-11-08 ENCOUNTER — Encounter: Payer: Medicare Other | Admitting: Interventional Cardiology

## 2013-11-12 ENCOUNTER — Telehealth: Payer: Self-pay

## 2013-11-12 NOTE — Telephone Encounter (Signed)
Signed [facility] cardiac rehab form faxed 11/09/13 

## 2013-11-15 ENCOUNTER — Encounter: Payer: Self-pay | Admitting: Interventional Cardiology

## 2013-11-20 ENCOUNTER — Encounter (INDEPENDENT_AMBULATORY_CARE_PROVIDER_SITE_OTHER): Payer: Self-pay

## 2013-11-20 ENCOUNTER — Encounter: Payer: Self-pay | Admitting: Interventional Cardiology

## 2013-11-20 ENCOUNTER — Ambulatory Visit (INDEPENDENT_AMBULATORY_CARE_PROVIDER_SITE_OTHER): Payer: Medicare Other | Admitting: Interventional Cardiology

## 2013-11-20 VITALS — BP 124/62 | HR 60 | Ht 62.0 in | Wt 199.0 lb

## 2013-11-20 DIAGNOSIS — E785 Hyperlipidemia, unspecified: Secondary | ICD-10-CM

## 2013-11-20 DIAGNOSIS — I119 Hypertensive heart disease without heart failure: Secondary | ICD-10-CM

## 2013-11-20 DIAGNOSIS — I2510 Atherosclerotic heart disease of native coronary artery without angina pectoris: Secondary | ICD-10-CM

## 2013-11-20 DIAGNOSIS — I214 Non-ST elevation (NSTEMI) myocardial infarction: Secondary | ICD-10-CM

## 2013-11-20 NOTE — Progress Notes (Signed)
Patient ID: Caroline Medina, female   DOB: November 06, 1940, 73 y.o.   MRN: [id number]    [address], [address], [place]  [address] Phone: [phone] Fax:  [phone]  Date:  11/20/2013   ID:  Caroline Medina, DOB October 25, 1941, MRN [id number]  PCP:  Theressa Millard, MD   ASSESSMENT:  1. Recent ACS treated with drug-eluting stent to circumflex, December 2014 2. Coronary artery disease, with prior RCA stent, remote 3. Hypertension 4. Hyperlipidemia  PLAN:  1. Continue current medical regimen. We discussed importance of aspirin and Brilinta 2. Increase physical activity 3. Discussed smoking cessation   SUBJECTIVE: Caroline Medina is a 73 y.o. female who has had no recurrence of angina since circumflex stenting in the setting of ACS . She continues to smoke cigarettes. She denies dyspnea. No lightheadedness or dizziness. She has tendency towards depression. She is on multiple medications. No bleeding on antiplatelet regimen.   Wt Readings from Last 3 Encounters:  11/20/13 199 lb (90.266 kg)  10/14/13 205 lb (92.987 kg)  10/14/13 205 lb (92.987 kg)     Past Medical History  Diagnosis Date  . Diabetes mellitus   . Hyperlipidemia   . Kidney stones   . Hypertension   . GERD (gastroesophageal reflux disease)   . Asthma 10/14/2013  . Adrenal adenoma (bilateral) 10/14/2013  . Obesity (BMI 30-39.9) 10/14/2013  . Depressive disorder, not elsewhere classified   . Epistaxis     allergic rhinnitis plus plavix  . CHF (congestive heart failure)     left heart failure  . Chronic diastolic CHF (congestive heart failure)   . COPD (chronic obstructive pulmonary disease)   . Coronary artery disease     with RCA DES 09/2007  . Coronary atherosclerosis of native coronary artery     Current Outpatient Prescriptions  Medication Sig Dispense Refill  . albuterol (PROVENTIL HFA;VENTOLIN HFA) 108 (90 BASE) MCG/ACT inhaler Inhale 2 puffs into the lungs every 6 (six) hours as  needed for wheezing or shortness of breath.      . ALPRAZolam (XANAX) 0.5 MG tablet Take 0.5 mg by mouth as needed for anxiety (as directed).      Marland Kitchen amitriptyline (ELAVIL) 25 MG tablet Take 1 tablet (25 mg total) by mouth at bedtime.      Marland Kitchen aspirin 81 MG tablet Take 81 mg by mouth daily.        . bumetanide (BUMEX) 2 MG tablet Take 2 mg by mouth daily.      . calcium carbonate (OS-CAL) 600 MG TABS tablet Take 600 mg by mouth QID.      Marland Kitchen citalopram (CELEXA) 20 MG tablet Take 20 mg by mouth daily.      . cloNIDine (CATAPRES) 0.1 MG tablet Take by mouth 2 (two) times daily.        Marland Kitchen diltiazem (DILACOR XR) 240 MG 24 hr capsule       . esomeprazole (NEXIUM) 40 MG capsule Take 40 mg by mouth daily.      . ferrous sulfate 325 (65 FE) MG tablet Take 325 mg by mouth daily with breakfast.      . fish oil-omega-3 fatty acids 1000 MG capsule Take 2 g by mouth daily.      . fluticasone (FLOVENT HFA) 110 MCG/ACT inhaler Inhale 1 puff into the lungs as needed.      . folic acid (FOLVITE) 1 MG tablet Take 1 mg by mouth daily.      Marland Kitchen  gabapentin (NEURONTIN) 300 MG capsule Take 300 mg by mouth 2 (two) times daily.      Marland Kitchen glipizide-metformin (METAGLIP) 2.5-250 MG per tablet Take 4 tablets by mouth daily.      Marland Kitchen HYDROCODONE-APAP-DIETARY PROD PO 7.5 /500 Miscellaneous q.8 hours orally prn      . INVESTIGATIONAL DRUG SIMPLE RECORD Take 7.5 mg by mouth 2 (two) times daily.  7.5 mg  prn  . ipratropium (ATROVENT HFA) 17 MCG/ACT inhaler Inhale 2 puffs into the lungs as needed for wheezing.      Marland Kitchen losartan (COZAAR) 100 MG tablet       . metFORMIN (GLUCOPHAGE) 1000 MG tablet Take 1,000 mg by mouth 2 (two) times daily with a meal.        . montelukast (SINGULAIR) 10 MG tablet Take 10 mg by mouth at bedtime.      . Multiple Vitamins-Minerals (MULTIVITAMIN WITH MINERALS) tablet Take 1 tablet by mouth daily.      . nitroGLYCERIN (NITROSTAT) 0.4 MG SL tablet Place 1 tablet (0.4 mg total) under the tongue every 5 (five) minutes  as needed for chest pain.  25 tablet  12  . omeprazole (PRILOSEC) 20 MG capsule Take 1 capsule by mouth Daily.      . pioglitazone (ACTOS) 45 MG tablet Take 45 mg by mouth daily.      . potassium chloride (KLOR-CON) 20 MEQ packet Take 20 mEq by mouth daily.      . quiNINE (QUALAQUIN) 324 MG capsule Take 324 mg by mouth as needed.      . rosuvastatin (CRESTOR) 10 MG tablet Take 10 mg by mouth daily.      . simvastatin (ZOCOR) 10 MG tablet       . sitaGLIPtin (JANUVIA) 25 MG tablet Take 25 mg by mouth daily.        . Ticagrelor (BRILINTA) 90 MG TABS tablet Take 1 tablet (90 mg total) by mouth 2 (two) times daily.  60 tablet  11  . valsartan (DIOVAN) 160 MG tablet Take by mouth daily.        Marland Kitchen zolpidem (AMBIEN) 10 MG tablet Take 10 mg by mouth at bedtime as needed for sleep.       No current facility-administered medications for this visit.    Allergies:    Allergies  Allergen Reactions  . Lisinopril   . Penicillins Rash    Social History:  The patient  reports that she has been smoking Cigarettes.  She has a 50 pack-year smoking history. She has never used smokeless tobacco. She reports that she drinks alcohol. She reports that she does not use illicit drugs.   ROS:  Please see the history of present illness.   Denies syncope   All other systems reviewed and negative.   OBJECTIVE: VS:  BP 124/62  Pulse 60  Ht 5\' 2"  (1.575 m)  Wt 199 lb (90.266 kg)  BMI 36.39 kg/m2 Well nourished, well developed, in no acute distress, obese HEENT: normal Neck: JVD flat. Carotid bruit absent  Cardiac:  normal S1, S2; RRR; no murmur Lungs:  clear to auscultation bilaterally, no wheezing, rhonchi or rales Abd: soft, nontender, no hepatomegaly Ext: Edema absent. Pulses 2+ and symmetric Skin: warm and dry Neuro:  CNs 2-12 intact, no focal abnormalities noted  EKG:  Not performed       Signed, Illene Labrador III, MD 11/20/2013 3:56 PM

## 2013-11-20 NOTE — Patient Instructions (Signed)
Your physician recommends that you continue on your current medications as directed. Please refer to the Current Medication list given to you today.  Your physician wants you to follow-up in: 4 MONTHS.  You will receive a reminder letter in the mail two months in advance. If you don't receive a letter, please call our office to schedule the follow-up appointment.  

## 2013-11-29 ENCOUNTER — Ambulatory Visit (HOSPITAL_COMMUNITY): Payer: Medicare Other

## 2013-12-03 ENCOUNTER — Ambulatory Visit (HOSPITAL_COMMUNITY): Payer: Medicare Other

## 2013-12-05 ENCOUNTER — Ambulatory Visit (HOSPITAL_COMMUNITY): Payer: Medicare Other

## 2013-12-07 ENCOUNTER — Ambulatory Visit (HOSPITAL_COMMUNITY): Payer: Medicare Other

## 2013-12-10 ENCOUNTER — Ambulatory Visit (HOSPITAL_COMMUNITY): Payer: Medicare Other

## 2013-12-12 ENCOUNTER — Ambulatory Visit (HOSPITAL_COMMUNITY): Payer: Medicare Other

## 2013-12-14 ENCOUNTER — Ambulatory Visit (HOSPITAL_COMMUNITY): Payer: Medicare Other

## 2013-12-17 ENCOUNTER — Ambulatory Visit (HOSPITAL_COMMUNITY): Payer: Medicare Other

## 2013-12-19 ENCOUNTER — Ambulatory Visit (HOSPITAL_COMMUNITY): Payer: Medicare Other

## 2013-12-21 ENCOUNTER — Ambulatory Visit (HOSPITAL_COMMUNITY): Payer: Medicare Other

## 2013-12-24 ENCOUNTER — Ambulatory Visit (HOSPITAL_COMMUNITY): Payer: Medicare Other

## 2013-12-26 ENCOUNTER — Ambulatory Visit (HOSPITAL_COMMUNITY): Payer: Medicare Other

## 2013-12-28 ENCOUNTER — Ambulatory Visit (HOSPITAL_COMMUNITY): Payer: Medicare Other

## 2013-12-31 ENCOUNTER — Ambulatory Visit (HOSPITAL_COMMUNITY): Payer: Medicare Other

## 2014-01-02 ENCOUNTER — Ambulatory Visit (HOSPITAL_COMMUNITY): Payer: Medicare Other

## 2014-01-04 ENCOUNTER — Ambulatory Visit (HOSPITAL_COMMUNITY): Payer: Medicare Other

## 2014-01-07 ENCOUNTER — Ambulatory Visit (HOSPITAL_COMMUNITY): Payer: Medicare Other

## 2014-01-09 ENCOUNTER — Ambulatory Visit (HOSPITAL_COMMUNITY): Payer: Medicare Other

## 2014-01-11 ENCOUNTER — Ambulatory Visit (HOSPITAL_COMMUNITY): Payer: Medicare Other

## 2014-01-14 ENCOUNTER — Ambulatory Visit (HOSPITAL_COMMUNITY): Payer: Medicare Other

## 2014-01-16 ENCOUNTER — Ambulatory Visit (HOSPITAL_COMMUNITY): Payer: Medicare Other

## 2014-01-18 ENCOUNTER — Ambulatory Visit (HOSPITAL_COMMUNITY): Payer: Medicare Other

## 2014-01-21 ENCOUNTER — Ambulatory Visit (HOSPITAL_COMMUNITY): Payer: Medicare Other

## 2014-01-23 ENCOUNTER — Ambulatory Visit (HOSPITAL_COMMUNITY): Payer: Medicare Other

## 2014-01-25 ENCOUNTER — Ambulatory Visit (HOSPITAL_COMMUNITY): Payer: Medicare Other

## 2014-01-28 ENCOUNTER — Ambulatory Visit (HOSPITAL_COMMUNITY): Payer: Medicare Other

## 2014-01-30 ENCOUNTER — Ambulatory Visit (HOSPITAL_COMMUNITY): Payer: Medicare Other

## 2014-02-01 ENCOUNTER — Ambulatory Visit (HOSPITAL_COMMUNITY): Payer: Medicare Other

## 2014-02-04 ENCOUNTER — Ambulatory Visit (HOSPITAL_COMMUNITY): Payer: Medicare Other

## 2014-02-06 ENCOUNTER — Ambulatory Visit (HOSPITAL_COMMUNITY): Payer: Medicare Other

## 2014-02-08 ENCOUNTER — Ambulatory Visit (HOSPITAL_COMMUNITY): Payer: Medicare Other

## 2014-02-11 ENCOUNTER — Ambulatory Visit (HOSPITAL_COMMUNITY): Payer: Medicare Other

## 2014-02-13 ENCOUNTER — Ambulatory Visit (HOSPITAL_COMMUNITY): Payer: Medicare Other

## 2014-02-15 ENCOUNTER — Ambulatory Visit (HOSPITAL_COMMUNITY): Payer: Medicare Other

## 2014-02-18 ENCOUNTER — Ambulatory Visit (HOSPITAL_COMMUNITY): Payer: Medicare Other

## 2014-02-20 ENCOUNTER — Ambulatory Visit (HOSPITAL_COMMUNITY): Payer: Medicare Other

## 2014-02-22 ENCOUNTER — Ambulatory Visit (HOSPITAL_COMMUNITY): Payer: Medicare Other

## 2014-02-25 ENCOUNTER — Ambulatory Visit (HOSPITAL_COMMUNITY): Payer: Medicare Other

## 2014-02-27 ENCOUNTER — Ambulatory Visit (HOSPITAL_COMMUNITY): Payer: Medicare Other

## 2014-03-01 ENCOUNTER — Ambulatory Visit (HOSPITAL_COMMUNITY): Payer: Medicare Other

## 2014-03-04 ENCOUNTER — Ambulatory Visit (HOSPITAL_COMMUNITY): Payer: Medicare Other

## 2014-03-06 ENCOUNTER — Ambulatory Visit (HOSPITAL_COMMUNITY): Payer: Medicare Other

## 2014-03-08 ENCOUNTER — Ambulatory Visit (HOSPITAL_COMMUNITY): Payer: Medicare Other

## 2014-10-03 ENCOUNTER — Encounter (HOSPITAL_COMMUNITY): Payer: Self-pay | Admitting: Cardiology

## 2014-12-12 DIAGNOSIS — E119 Type 2 diabetes mellitus without complications: Secondary | ICD-10-CM | POA: Diagnosis not present

## 2015-04-10 IMAGING — CR DG SHOULDER 2+V*L*
3 series · 3 of 3 positions shown · non-contrast
Comparison: None.

CLINICAL DATA: Chronic left shoulder pain.

EXAM:
LEFT SHOULDER - 2+ VIEW

[w shoulder ap internal left]
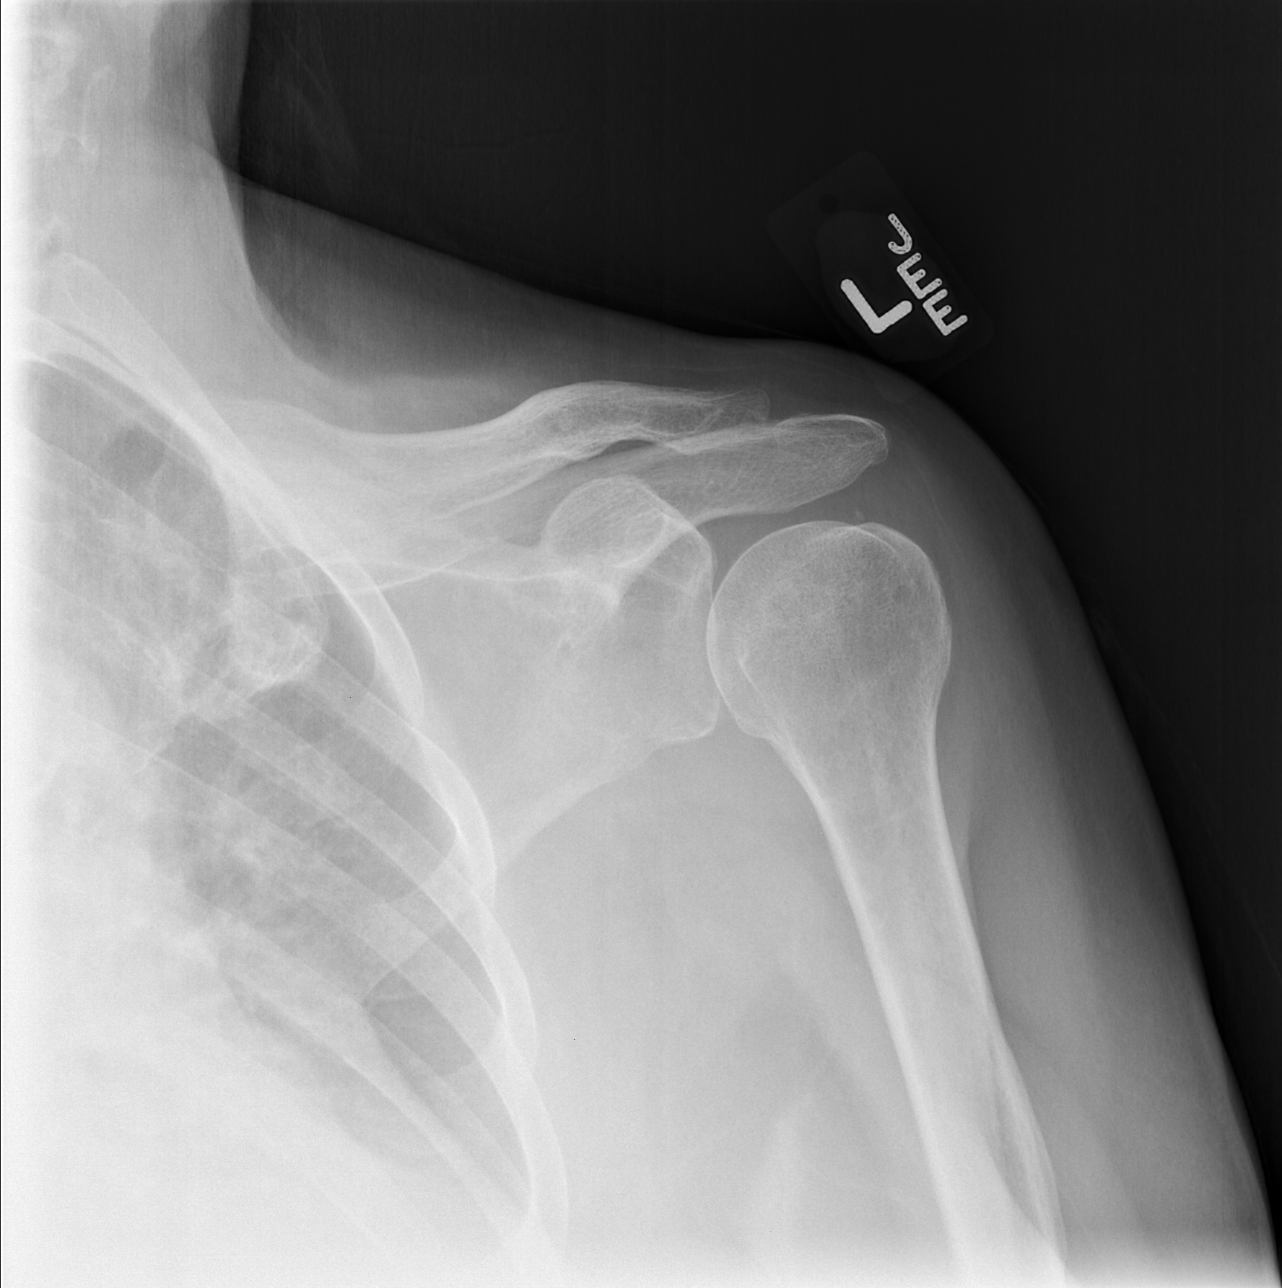

[w shoulder y view left *]
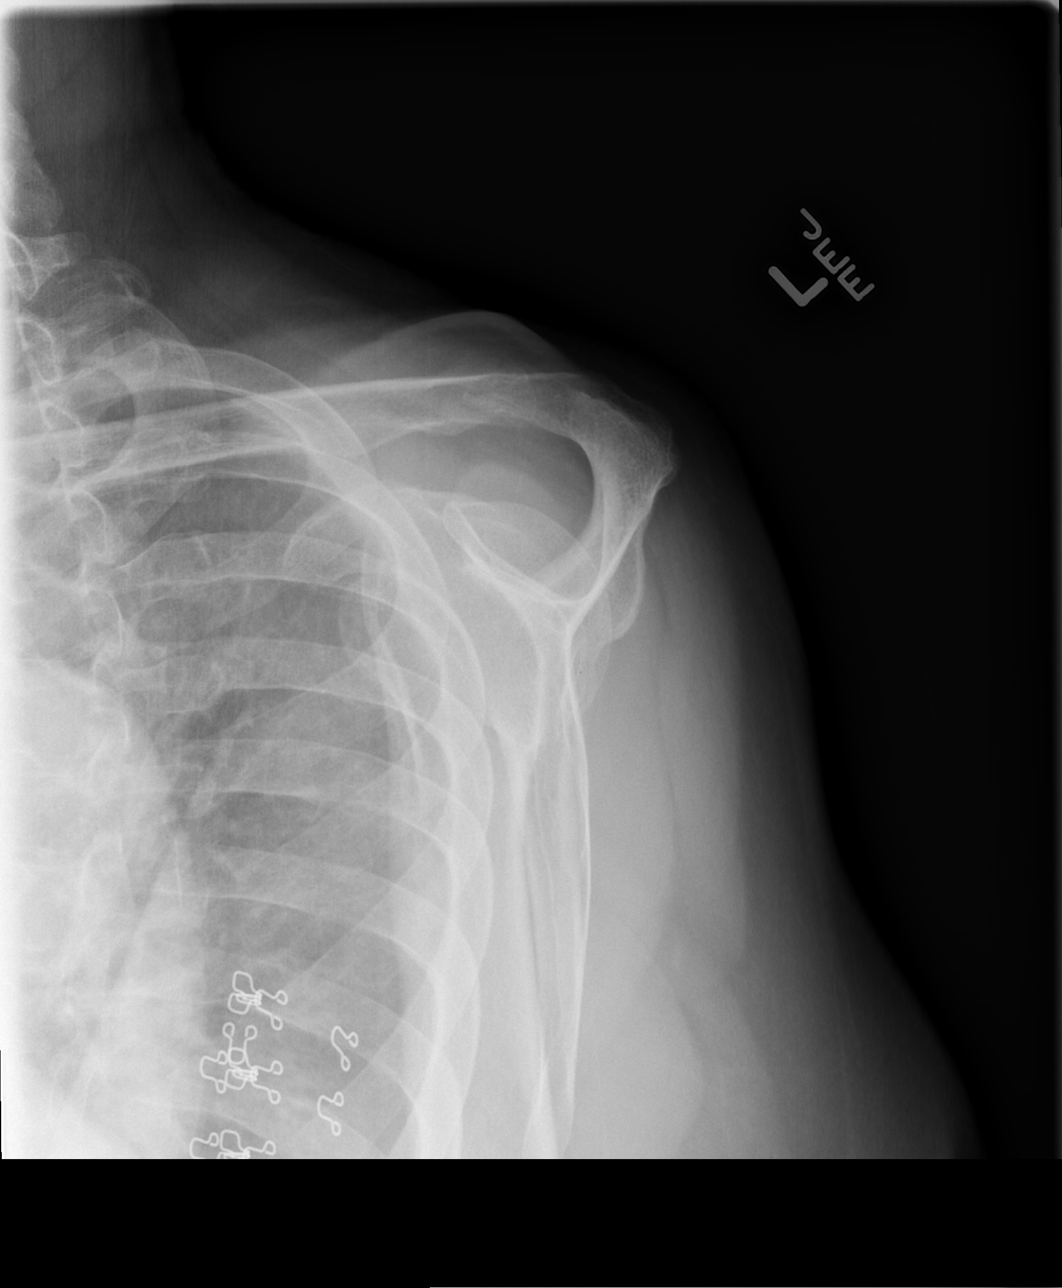

[x shoulder axillary left *]
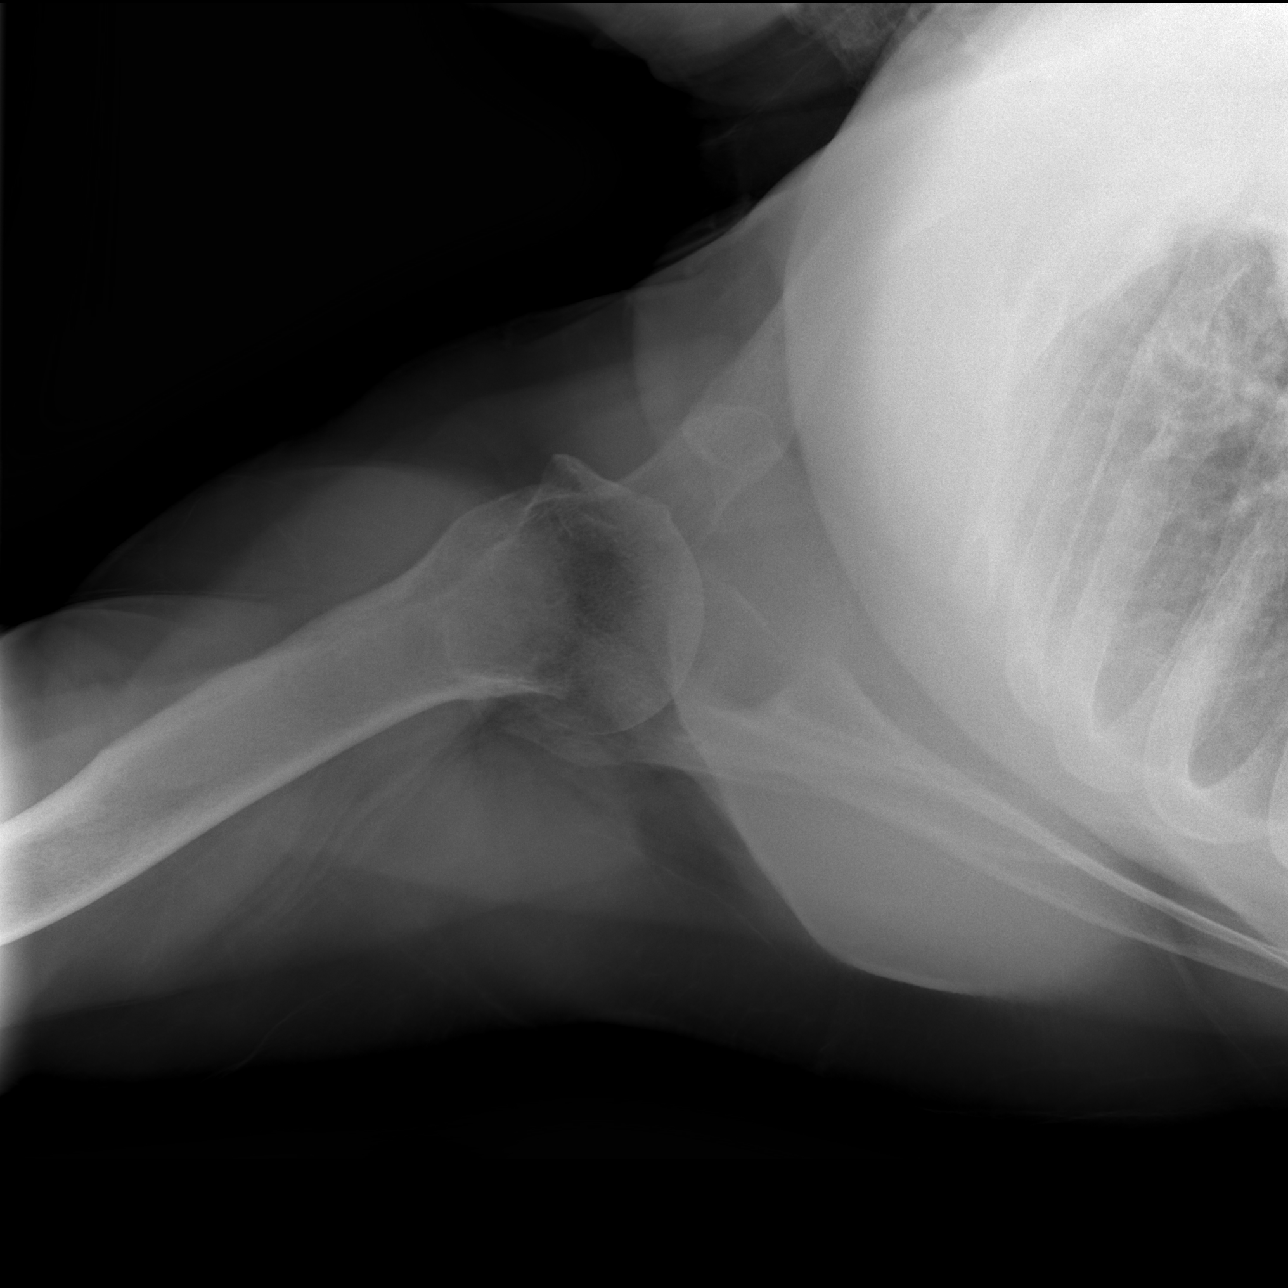

[3 of 3 positions shown; findings below may reference images not displayed]

FINDINGS: No fracture or dislocation.

Single small bone density above the greater tuberosity suggests
minor rotator cuff calcific tendinitis.

Glenohumeral and AC joints are normally space and aligned. The
underlying ribs are intact.
IMPRESSION: No fracture or dislocation.

Minor rotator cuff calcific tendinitis.

## 2015-04-10 IMAGING — CR DG CERVICAL SPINE COMPLETE 4+V
6 series · 6 of 6 positions shown · non-contrast
Comparison: None.

CLINICAL DATA: Chronic left neck and shoulder pain. No known
injury.

EXAM:
CERVICAL SPINE  4+ VIEWS

[w c-spine lat]
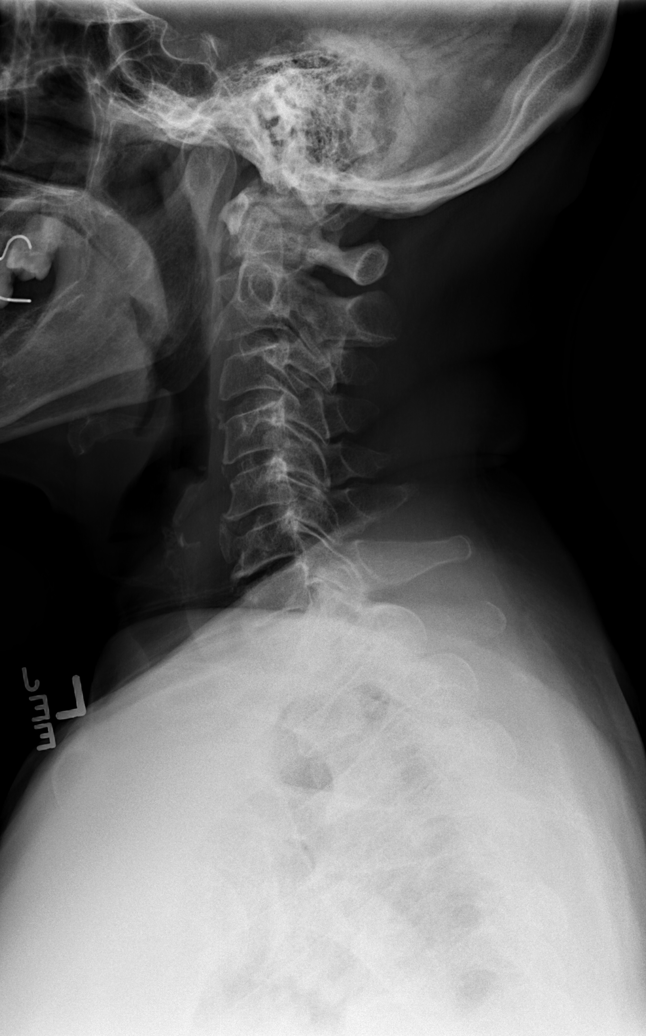

[w c-spine oblique (1 of 2)]
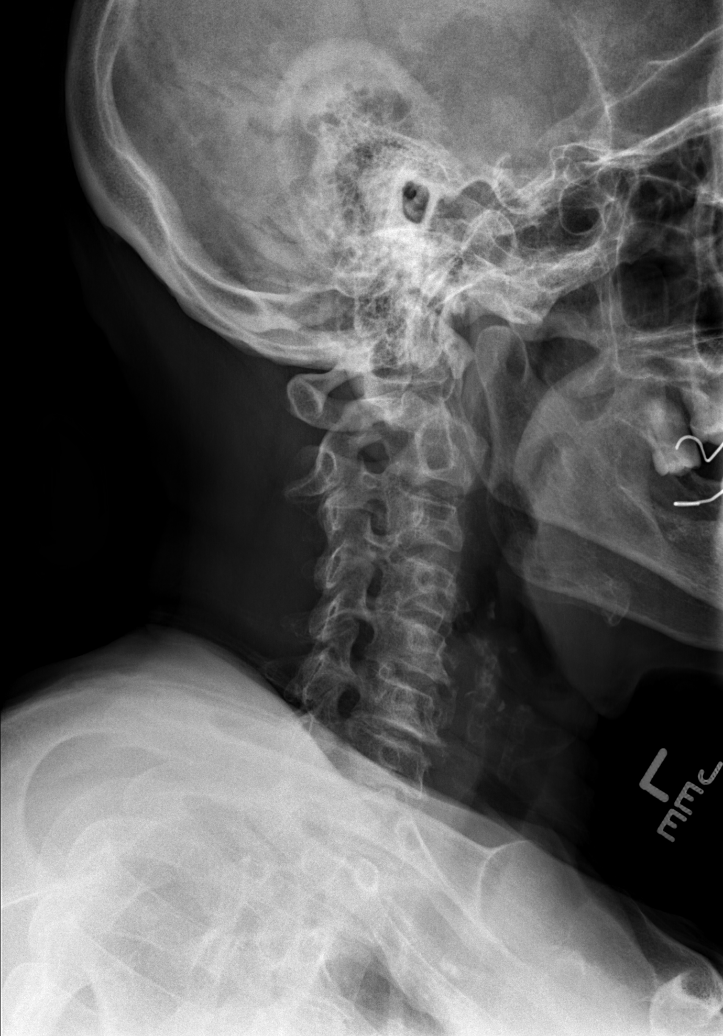

[w c-spine oblique (2 of 2)]
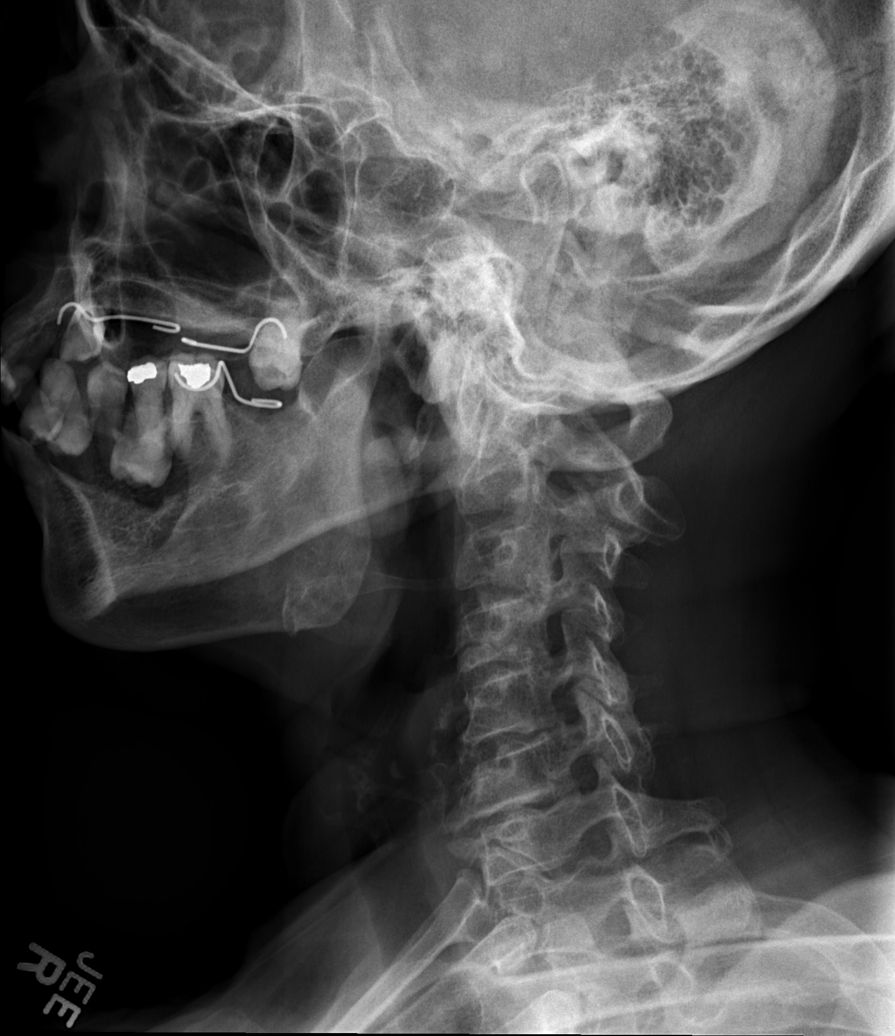

[w c-spine a.p.]
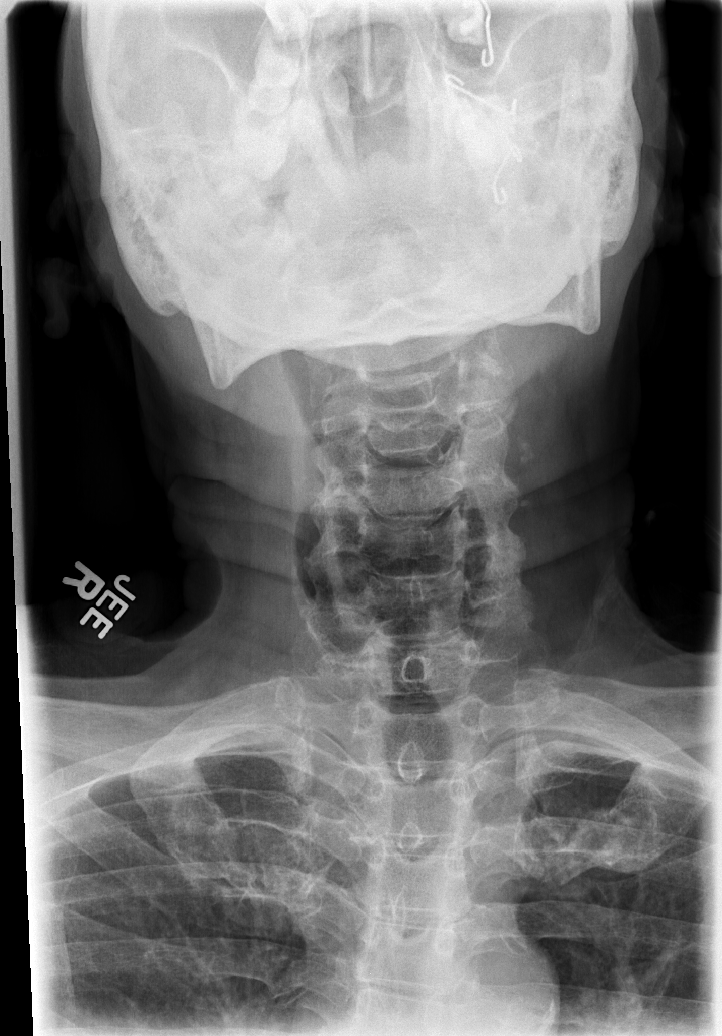

[w c-spine odontoid]
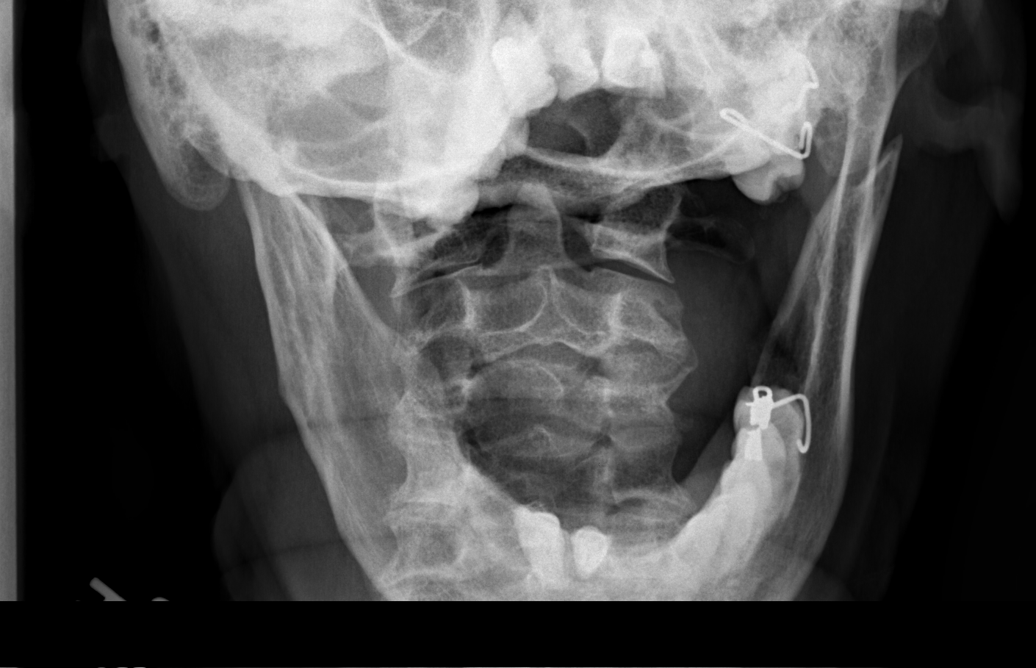

[w swimmers view *]
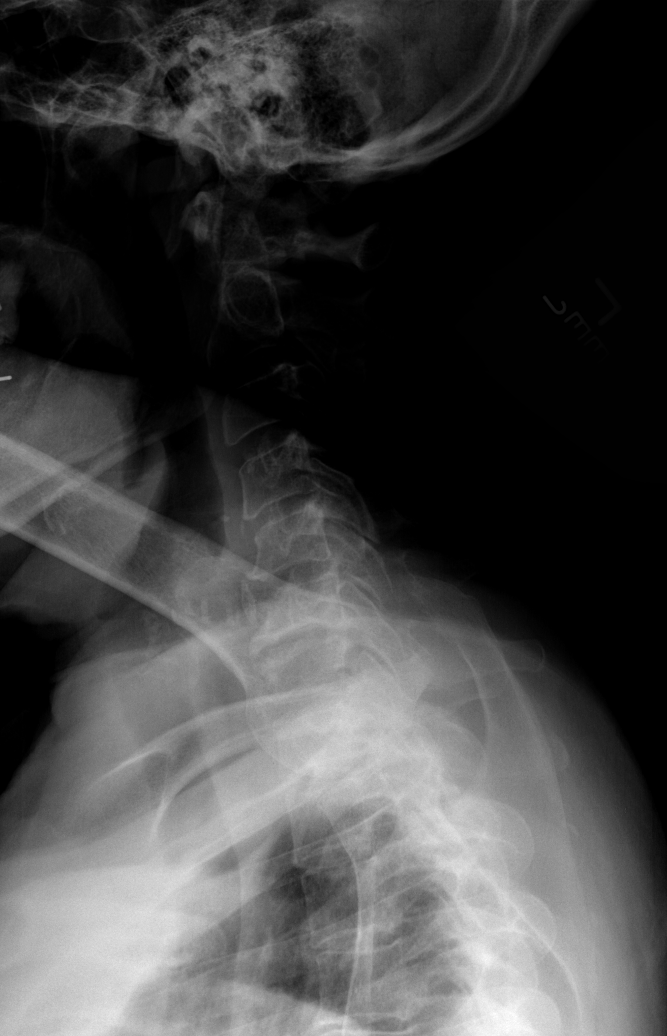

[6 of 6 positions shown; findings below may reference images not displayed]

FINDINGS: No fracture. No spondylolisthesis. There is mild loss of disc height
at C4-C5 with mild to moderate loss of disc height at C6-C7. There
are endplate osteophytes at C4, C5, C6 and C7.

Mild neural foraminal narrowing is noted on the right at C3-C4,
C4-C5, C5-C6 and C6-C7 mostly from uncovertebral spurring. Similar
changes are noted on the left. The left C3-C4 neural foramen appears
more significantly narrowed, moderate in severity.

There are carotid vascular calcifications on the left. The soft
tissues are otherwise unremarkable.
IMPRESSION: No fracture or acute finding.

Degenerative changes as detailed.

## 2015-10-10 DIAGNOSIS — I252 Old myocardial infarction: Secondary | ICD-10-CM | POA: Diagnosis not present

## 2015-10-10 DIAGNOSIS — E1140 Type 2 diabetes mellitus with diabetic neuropathy, unspecified: Secondary | ICD-10-CM | POA: Diagnosis not present

## 2015-10-10 DIAGNOSIS — Z0000 Encounter for general adult medical examination without abnormal findings: Secondary | ICD-10-CM | POA: Diagnosis not present

## 2015-10-10 DIAGNOSIS — Z1239 Encounter for other screening for malignant neoplasm of breast: Secondary | ICD-10-CM | POA: Diagnosis not present

## 2015-10-10 DIAGNOSIS — Z1272 Encounter for screening for malignant neoplasm of vagina: Secondary | ICD-10-CM | POA: Diagnosis not present

## 2015-10-24 ENCOUNTER — Other Ambulatory Visit: Payer: Self-pay | Admitting: Physician Assistant

## 2015-10-24 DIAGNOSIS — Z1231 Encounter for screening mammogram for malignant neoplasm of breast: Secondary | ICD-10-CM

## 2015-10-27 ENCOUNTER — Encounter: Payer: Self-pay | Admitting: Physician Assistant

## 2015-10-27 DIAGNOSIS — I10 Essential (primary) hypertension: Secondary | ICD-10-CM | POA: Insufficient documentation

## 2015-10-27 DIAGNOSIS — I5032 Chronic diastolic (congestive) heart failure: Secondary | ICD-10-CM | POA: Insufficient documentation

## 2015-10-27 NOTE — Progress Notes (Deleted)
No show

## 2015-10-28 ENCOUNTER — Encounter: Payer: Self-pay | Admitting: Physician Assistant

## 2015-10-28 NOTE — Progress Notes (Signed)
This encounter was created in error - please disregard.

## 2015-10-30 ENCOUNTER — Encounter: Payer: Self-pay | Admitting: Physician Assistant

## 2015-10-30 DIAGNOSIS — M6281 Muscle weakness (generalized): Secondary | ICD-10-CM | POA: Diagnosis not present

## 2015-10-30 DIAGNOSIS — I252 Old myocardial infarction: Secondary | ICD-10-CM | POA: Diagnosis not present

## 2015-10-30 DIAGNOSIS — I110 Hypertensive heart disease with heart failure: Secondary | ICD-10-CM | POA: Diagnosis not present

## 2015-10-30 DIAGNOSIS — E782 Mixed hyperlipidemia: Secondary | ICD-10-CM | POA: Diagnosis not present

## 2015-10-30 DIAGNOSIS — E1140 Type 2 diabetes mellitus with diabetic neuropathy, unspecified: Secondary | ICD-10-CM | POA: Diagnosis not present

## 2015-10-30 DIAGNOSIS — J45909 Unspecified asthma, uncomplicated: Secondary | ICD-10-CM | POA: Diagnosis not present

## 2015-10-30 DIAGNOSIS — I509 Heart failure, unspecified: Secondary | ICD-10-CM | POA: Diagnosis not present

## 2015-10-30 DIAGNOSIS — Z9181 History of falling: Secondary | ICD-10-CM | POA: Diagnosis not present

## 2015-10-30 DIAGNOSIS — I2510 Atherosclerotic heart disease of native coronary artery without angina pectoris: Secondary | ICD-10-CM | POA: Diagnosis not present

## 2015-10-30 DIAGNOSIS — K219 Gastro-esophageal reflux disease without esophagitis: Secondary | ICD-10-CM | POA: Diagnosis not present

## 2015-10-30 DIAGNOSIS — Z7982 Long term (current) use of aspirin: Secondary | ICD-10-CM | POA: Diagnosis not present

## 2015-10-31 DIAGNOSIS — I110 Hypertensive heart disease with heart failure: Secondary | ICD-10-CM | POA: Diagnosis not present

## 2015-10-31 DIAGNOSIS — E782 Mixed hyperlipidemia: Secondary | ICD-10-CM | POA: Diagnosis not present

## 2015-10-31 DIAGNOSIS — I2510 Atherosclerotic heart disease of native coronary artery without angina pectoris: Secondary | ICD-10-CM | POA: Diagnosis not present

## 2015-10-31 DIAGNOSIS — K219 Gastro-esophageal reflux disease without esophagitis: Secondary | ICD-10-CM | POA: Diagnosis not present

## 2015-10-31 DIAGNOSIS — M6281 Muscle weakness (generalized): Secondary | ICD-10-CM | POA: Diagnosis not present

## 2015-10-31 DIAGNOSIS — J45909 Unspecified asthma, uncomplicated: Secondary | ICD-10-CM | POA: Diagnosis not present

## 2015-10-31 DIAGNOSIS — Z9181 History of falling: Secondary | ICD-10-CM | POA: Diagnosis not present

## 2015-10-31 DIAGNOSIS — I252 Old myocardial infarction: Secondary | ICD-10-CM | POA: Diagnosis not present

## 2015-10-31 DIAGNOSIS — E1140 Type 2 diabetes mellitus with diabetic neuropathy, unspecified: Secondary | ICD-10-CM | POA: Diagnosis not present

## 2015-10-31 DIAGNOSIS — Z7982 Long term (current) use of aspirin: Secondary | ICD-10-CM | POA: Diagnosis not present

## 2015-10-31 DIAGNOSIS — I509 Heart failure, unspecified: Secondary | ICD-10-CM | POA: Diagnosis not present

## 2015-11-04 DIAGNOSIS — I110 Hypertensive heart disease with heart failure: Secondary | ICD-10-CM | POA: Diagnosis not present

## 2015-11-04 DIAGNOSIS — I2510 Atherosclerotic heart disease of native coronary artery without angina pectoris: Secondary | ICD-10-CM | POA: Diagnosis not present

## 2015-11-04 DIAGNOSIS — Z9181 History of falling: Secondary | ICD-10-CM | POA: Diagnosis not present

## 2015-11-04 DIAGNOSIS — Z7982 Long term (current) use of aspirin: Secondary | ICD-10-CM | POA: Diagnosis not present

## 2015-11-04 DIAGNOSIS — I509 Heart failure, unspecified: Secondary | ICD-10-CM | POA: Diagnosis not present

## 2015-11-04 DIAGNOSIS — I252 Old myocardial infarction: Secondary | ICD-10-CM | POA: Diagnosis not present

## 2015-11-04 DIAGNOSIS — E782 Mixed hyperlipidemia: Secondary | ICD-10-CM | POA: Diagnosis not present

## 2015-11-04 DIAGNOSIS — J45909 Unspecified asthma, uncomplicated: Secondary | ICD-10-CM | POA: Diagnosis not present

## 2015-11-04 DIAGNOSIS — M6281 Muscle weakness (generalized): Secondary | ICD-10-CM | POA: Diagnosis not present

## 2015-11-04 DIAGNOSIS — E1140 Type 2 diabetes mellitus with diabetic neuropathy, unspecified: Secondary | ICD-10-CM | POA: Diagnosis not present

## 2015-11-04 DIAGNOSIS — K219 Gastro-esophageal reflux disease without esophagitis: Secondary | ICD-10-CM | POA: Diagnosis not present

## 2015-11-05 DIAGNOSIS — E782 Mixed hyperlipidemia: Secondary | ICD-10-CM | POA: Diagnosis not present

## 2015-11-05 DIAGNOSIS — Z7982 Long term (current) use of aspirin: Secondary | ICD-10-CM | POA: Diagnosis not present

## 2015-11-05 DIAGNOSIS — I2510 Atherosclerotic heart disease of native coronary artery without angina pectoris: Secondary | ICD-10-CM | POA: Diagnosis not present

## 2015-11-05 DIAGNOSIS — I110 Hypertensive heart disease with heart failure: Secondary | ICD-10-CM | POA: Diagnosis not present

## 2015-11-05 DIAGNOSIS — Z9181 History of falling: Secondary | ICD-10-CM | POA: Diagnosis not present

## 2015-11-05 DIAGNOSIS — M6281 Muscle weakness (generalized): Secondary | ICD-10-CM | POA: Diagnosis not present

## 2015-11-05 DIAGNOSIS — I252 Old myocardial infarction: Secondary | ICD-10-CM | POA: Diagnosis not present

## 2015-11-05 DIAGNOSIS — K219 Gastro-esophageal reflux disease without esophagitis: Secondary | ICD-10-CM | POA: Diagnosis not present

## 2015-11-05 DIAGNOSIS — J45909 Unspecified asthma, uncomplicated: Secondary | ICD-10-CM | POA: Diagnosis not present

## 2015-11-05 DIAGNOSIS — I509 Heart failure, unspecified: Secondary | ICD-10-CM | POA: Diagnosis not present

## 2015-11-05 DIAGNOSIS — E1140 Type 2 diabetes mellitus with diabetic neuropathy, unspecified: Secondary | ICD-10-CM | POA: Diagnosis not present

## 2015-11-06 DIAGNOSIS — K219 Gastro-esophageal reflux disease without esophagitis: Secondary | ICD-10-CM | POA: Diagnosis not present

## 2015-11-06 DIAGNOSIS — Z9181 History of falling: Secondary | ICD-10-CM | POA: Diagnosis not present

## 2015-11-06 DIAGNOSIS — Z7982 Long term (current) use of aspirin: Secondary | ICD-10-CM | POA: Diagnosis not present

## 2015-11-06 DIAGNOSIS — J45909 Unspecified asthma, uncomplicated: Secondary | ICD-10-CM | POA: Diagnosis not present

## 2015-11-06 DIAGNOSIS — I110 Hypertensive heart disease with heart failure: Secondary | ICD-10-CM | POA: Diagnosis not present

## 2015-11-06 DIAGNOSIS — M6281 Muscle weakness (generalized): Secondary | ICD-10-CM | POA: Diagnosis not present

## 2015-11-06 DIAGNOSIS — I2510 Atherosclerotic heart disease of native coronary artery without angina pectoris: Secondary | ICD-10-CM | POA: Diagnosis not present

## 2015-11-06 DIAGNOSIS — I252 Old myocardial infarction: Secondary | ICD-10-CM | POA: Diagnosis not present

## 2015-11-06 DIAGNOSIS — I509 Heart failure, unspecified: Secondary | ICD-10-CM | POA: Diagnosis not present

## 2015-11-06 DIAGNOSIS — E782 Mixed hyperlipidemia: Secondary | ICD-10-CM | POA: Diagnosis not present

## 2015-11-06 DIAGNOSIS — E1140 Type 2 diabetes mellitus with diabetic neuropathy, unspecified: Secondary | ICD-10-CM | POA: Diagnosis not present

## 2015-11-07 DIAGNOSIS — I252 Old myocardial infarction: Secondary | ICD-10-CM | POA: Diagnosis not present

## 2015-11-07 DIAGNOSIS — E1140 Type 2 diabetes mellitus with diabetic neuropathy, unspecified: Secondary | ICD-10-CM | POA: Diagnosis not present

## 2015-11-07 DIAGNOSIS — E782 Mixed hyperlipidemia: Secondary | ICD-10-CM | POA: Diagnosis not present

## 2015-11-07 DIAGNOSIS — I509 Heart failure, unspecified: Secondary | ICD-10-CM | POA: Diagnosis not present

## 2015-11-07 DIAGNOSIS — I2510 Atherosclerotic heart disease of native coronary artery without angina pectoris: Secondary | ICD-10-CM | POA: Diagnosis not present

## 2015-11-07 DIAGNOSIS — I110 Hypertensive heart disease with heart failure: Secondary | ICD-10-CM | POA: Diagnosis not present

## 2015-11-07 DIAGNOSIS — Z7982 Long term (current) use of aspirin: Secondary | ICD-10-CM | POA: Diagnosis not present

## 2015-11-07 DIAGNOSIS — Z9181 History of falling: Secondary | ICD-10-CM | POA: Diagnosis not present

## 2015-11-07 DIAGNOSIS — M6281 Muscle weakness (generalized): Secondary | ICD-10-CM | POA: Diagnosis not present

## 2015-11-07 DIAGNOSIS — K219 Gastro-esophageal reflux disease without esophagitis: Secondary | ICD-10-CM | POA: Diagnosis not present

## 2015-11-07 DIAGNOSIS — J45909 Unspecified asthma, uncomplicated: Secondary | ICD-10-CM | POA: Diagnosis not present

## 2015-11-08 DIAGNOSIS — K219 Gastro-esophageal reflux disease without esophagitis: Secondary | ICD-10-CM | POA: Diagnosis not present

## 2015-11-08 DIAGNOSIS — E782 Mixed hyperlipidemia: Secondary | ICD-10-CM | POA: Diagnosis not present

## 2015-11-08 DIAGNOSIS — I509 Heart failure, unspecified: Secondary | ICD-10-CM | POA: Diagnosis not present

## 2015-11-08 DIAGNOSIS — Z7982 Long term (current) use of aspirin: Secondary | ICD-10-CM | POA: Diagnosis not present

## 2015-11-08 DIAGNOSIS — E1140 Type 2 diabetes mellitus with diabetic neuropathy, unspecified: Secondary | ICD-10-CM | POA: Diagnosis not present

## 2015-11-08 DIAGNOSIS — I110 Hypertensive heart disease with heart failure: Secondary | ICD-10-CM | POA: Diagnosis not present

## 2015-11-08 DIAGNOSIS — M6281 Muscle weakness (generalized): Secondary | ICD-10-CM | POA: Diagnosis not present

## 2015-11-08 DIAGNOSIS — I252 Old myocardial infarction: Secondary | ICD-10-CM | POA: Diagnosis not present

## 2015-11-08 DIAGNOSIS — I2510 Atherosclerotic heart disease of native coronary artery without angina pectoris: Secondary | ICD-10-CM | POA: Diagnosis not present

## 2015-11-08 DIAGNOSIS — J45909 Unspecified asthma, uncomplicated: Secondary | ICD-10-CM | POA: Diagnosis not present

## 2015-11-08 DIAGNOSIS — Z9181 History of falling: Secondary | ICD-10-CM | POA: Diagnosis not present

## 2015-11-11 DIAGNOSIS — M6281 Muscle weakness (generalized): Secondary | ICD-10-CM | POA: Diagnosis not present

## 2015-11-11 DIAGNOSIS — E782 Mixed hyperlipidemia: Secondary | ICD-10-CM | POA: Diagnosis not present

## 2015-11-11 DIAGNOSIS — I2510 Atherosclerotic heart disease of native coronary artery without angina pectoris: Secondary | ICD-10-CM | POA: Diagnosis not present

## 2015-11-11 DIAGNOSIS — J45909 Unspecified asthma, uncomplicated: Secondary | ICD-10-CM | POA: Diagnosis not present

## 2015-11-11 DIAGNOSIS — Z9181 History of falling: Secondary | ICD-10-CM | POA: Diagnosis not present

## 2015-11-11 DIAGNOSIS — I252 Old myocardial infarction: Secondary | ICD-10-CM | POA: Diagnosis not present

## 2015-11-11 DIAGNOSIS — E1140 Type 2 diabetes mellitus with diabetic neuropathy, unspecified: Secondary | ICD-10-CM | POA: Diagnosis not present

## 2015-11-11 DIAGNOSIS — K219 Gastro-esophageal reflux disease without esophagitis: Secondary | ICD-10-CM | POA: Diagnosis not present

## 2015-11-11 DIAGNOSIS — Z7982 Long term (current) use of aspirin: Secondary | ICD-10-CM | POA: Diagnosis not present

## 2015-11-11 DIAGNOSIS — I509 Heart failure, unspecified: Secondary | ICD-10-CM | POA: Diagnosis not present

## 2015-11-11 DIAGNOSIS — I110 Hypertensive heart disease with heart failure: Secondary | ICD-10-CM | POA: Diagnosis not present

## 2015-11-13 DIAGNOSIS — M6281 Muscle weakness (generalized): Secondary | ICD-10-CM | POA: Diagnosis not present

## 2015-11-13 DIAGNOSIS — I509 Heart failure, unspecified: Secondary | ICD-10-CM | POA: Diagnosis not present

## 2015-11-13 DIAGNOSIS — I110 Hypertensive heart disease with heart failure: Secondary | ICD-10-CM | POA: Diagnosis not present

## 2015-11-13 DIAGNOSIS — Z9181 History of falling: Secondary | ICD-10-CM | POA: Diagnosis not present

## 2015-11-13 DIAGNOSIS — K219 Gastro-esophageal reflux disease without esophagitis: Secondary | ICD-10-CM | POA: Diagnosis not present

## 2015-11-13 DIAGNOSIS — I252 Old myocardial infarction: Secondary | ICD-10-CM | POA: Diagnosis not present

## 2015-11-13 DIAGNOSIS — J45909 Unspecified asthma, uncomplicated: Secondary | ICD-10-CM | POA: Diagnosis not present

## 2015-11-13 DIAGNOSIS — Z7982 Long term (current) use of aspirin: Secondary | ICD-10-CM | POA: Diagnosis not present

## 2015-11-13 DIAGNOSIS — I2510 Atherosclerotic heart disease of native coronary artery without angina pectoris: Secondary | ICD-10-CM | POA: Diagnosis not present

## 2015-11-13 DIAGNOSIS — E782 Mixed hyperlipidemia: Secondary | ICD-10-CM | POA: Diagnosis not present

## 2015-11-13 DIAGNOSIS — E1140 Type 2 diabetes mellitus with diabetic neuropathy, unspecified: Secondary | ICD-10-CM | POA: Diagnosis not present

## 2015-11-14 DIAGNOSIS — I509 Heart failure, unspecified: Secondary | ICD-10-CM | POA: Diagnosis not present

## 2015-11-14 DIAGNOSIS — E1140 Type 2 diabetes mellitus with diabetic neuropathy, unspecified: Secondary | ICD-10-CM | POA: Diagnosis not present

## 2015-11-14 DIAGNOSIS — I2510 Atherosclerotic heart disease of native coronary artery without angina pectoris: Secondary | ICD-10-CM | POA: Diagnosis not present

## 2015-11-14 DIAGNOSIS — Z9181 History of falling: Secondary | ICD-10-CM | POA: Diagnosis not present

## 2015-11-14 DIAGNOSIS — E782 Mixed hyperlipidemia: Secondary | ICD-10-CM | POA: Diagnosis not present

## 2015-11-14 DIAGNOSIS — M6281 Muscle weakness (generalized): Secondary | ICD-10-CM | POA: Diagnosis not present

## 2015-11-14 DIAGNOSIS — J45909 Unspecified asthma, uncomplicated: Secondary | ICD-10-CM | POA: Diagnosis not present

## 2015-11-14 DIAGNOSIS — I110 Hypertensive heart disease with heart failure: Secondary | ICD-10-CM | POA: Diagnosis not present

## 2015-11-14 DIAGNOSIS — K219 Gastro-esophageal reflux disease without esophagitis: Secondary | ICD-10-CM | POA: Diagnosis not present

## 2015-11-14 DIAGNOSIS — I252 Old myocardial infarction: Secondary | ICD-10-CM | POA: Diagnosis not present

## 2015-11-14 DIAGNOSIS — Z7982 Long term (current) use of aspirin: Secondary | ICD-10-CM | POA: Diagnosis not present

## 2015-11-17 DIAGNOSIS — K219 Gastro-esophageal reflux disease without esophagitis: Secondary | ICD-10-CM | POA: Diagnosis not present

## 2015-11-17 DIAGNOSIS — I2510 Atherosclerotic heart disease of native coronary artery without angina pectoris: Secondary | ICD-10-CM | POA: Diagnosis not present

## 2015-11-17 DIAGNOSIS — I509 Heart failure, unspecified: Secondary | ICD-10-CM | POA: Diagnosis not present

## 2015-11-17 DIAGNOSIS — Z9181 History of falling: Secondary | ICD-10-CM | POA: Diagnosis not present

## 2015-11-17 DIAGNOSIS — I110 Hypertensive heart disease with heart failure: Secondary | ICD-10-CM | POA: Diagnosis not present

## 2015-11-17 DIAGNOSIS — E1140 Type 2 diabetes mellitus with diabetic neuropathy, unspecified: Secondary | ICD-10-CM | POA: Diagnosis not present

## 2015-11-17 DIAGNOSIS — E782 Mixed hyperlipidemia: Secondary | ICD-10-CM | POA: Diagnosis not present

## 2015-11-17 DIAGNOSIS — I252 Old myocardial infarction: Secondary | ICD-10-CM | POA: Diagnosis not present

## 2015-11-17 DIAGNOSIS — Z7982 Long term (current) use of aspirin: Secondary | ICD-10-CM | POA: Diagnosis not present

## 2015-11-17 DIAGNOSIS — J45909 Unspecified asthma, uncomplicated: Secondary | ICD-10-CM | POA: Diagnosis not present

## 2015-11-17 DIAGNOSIS — M6281 Muscle weakness (generalized): Secondary | ICD-10-CM | POA: Diagnosis not present

## 2015-11-18 DIAGNOSIS — E1140 Type 2 diabetes mellitus with diabetic neuropathy, unspecified: Secondary | ICD-10-CM | POA: Diagnosis not present

## 2015-11-18 DIAGNOSIS — I252 Old myocardial infarction: Secondary | ICD-10-CM | POA: Diagnosis not present

## 2015-11-18 DIAGNOSIS — I509 Heart failure, unspecified: Secondary | ICD-10-CM | POA: Diagnosis not present

## 2015-11-18 DIAGNOSIS — M6281 Muscle weakness (generalized): Secondary | ICD-10-CM | POA: Diagnosis not present

## 2015-11-18 DIAGNOSIS — J45909 Unspecified asthma, uncomplicated: Secondary | ICD-10-CM | POA: Diagnosis not present

## 2015-11-18 DIAGNOSIS — E782 Mixed hyperlipidemia: Secondary | ICD-10-CM | POA: Diagnosis not present

## 2015-11-18 DIAGNOSIS — I2510 Atherosclerotic heart disease of native coronary artery without angina pectoris: Secondary | ICD-10-CM | POA: Diagnosis not present

## 2015-11-18 DIAGNOSIS — I110 Hypertensive heart disease with heart failure: Secondary | ICD-10-CM | POA: Diagnosis not present

## 2015-11-18 DIAGNOSIS — Z7982 Long term (current) use of aspirin: Secondary | ICD-10-CM | POA: Diagnosis not present

## 2015-11-18 DIAGNOSIS — Z9181 History of falling: Secondary | ICD-10-CM | POA: Diagnosis not present

## 2015-11-18 DIAGNOSIS — K219 Gastro-esophageal reflux disease without esophagitis: Secondary | ICD-10-CM | POA: Diagnosis not present

## 2015-11-20 ENCOUNTER — Other Ambulatory Visit: Payer: Self-pay

## 2015-11-20 DIAGNOSIS — I509 Heart failure, unspecified: Secondary | ICD-10-CM | POA: Diagnosis not present

## 2015-11-20 DIAGNOSIS — I252 Old myocardial infarction: Secondary | ICD-10-CM | POA: Diagnosis not present

## 2015-11-20 DIAGNOSIS — Z9181 History of falling: Secondary | ICD-10-CM | POA: Diagnosis not present

## 2015-11-20 DIAGNOSIS — I110 Hypertensive heart disease with heart failure: Secondary | ICD-10-CM | POA: Diagnosis not present

## 2015-11-20 DIAGNOSIS — K219 Gastro-esophageal reflux disease without esophagitis: Secondary | ICD-10-CM | POA: Diagnosis not present

## 2015-11-20 DIAGNOSIS — I2510 Atherosclerotic heart disease of native coronary artery without angina pectoris: Secondary | ICD-10-CM | POA: Diagnosis not present

## 2015-11-20 DIAGNOSIS — J45909 Unspecified asthma, uncomplicated: Secondary | ICD-10-CM | POA: Diagnosis not present

## 2015-11-20 DIAGNOSIS — Z7982 Long term (current) use of aspirin: Secondary | ICD-10-CM | POA: Diagnosis not present

## 2015-11-20 DIAGNOSIS — E782 Mixed hyperlipidemia: Secondary | ICD-10-CM | POA: Diagnosis not present

## 2015-11-20 DIAGNOSIS — E1140 Type 2 diabetes mellitus with diabetic neuropathy, unspecified: Secondary | ICD-10-CM | POA: Diagnosis not present

## 2015-11-20 DIAGNOSIS — M6281 Muscle weakness (generalized): Secondary | ICD-10-CM | POA: Diagnosis not present

## 2015-11-21 DIAGNOSIS — I252 Old myocardial infarction: Secondary | ICD-10-CM | POA: Diagnosis not present

## 2015-11-21 DIAGNOSIS — K219 Gastro-esophageal reflux disease without esophagitis: Secondary | ICD-10-CM | POA: Diagnosis not present

## 2015-11-21 DIAGNOSIS — Z9181 History of falling: Secondary | ICD-10-CM | POA: Diagnosis not present

## 2015-11-21 DIAGNOSIS — I2510 Atherosclerotic heart disease of native coronary artery without angina pectoris: Secondary | ICD-10-CM | POA: Diagnosis not present

## 2015-11-21 DIAGNOSIS — E1140 Type 2 diabetes mellitus with diabetic neuropathy, unspecified: Secondary | ICD-10-CM | POA: Diagnosis not present

## 2015-11-21 DIAGNOSIS — I509 Heart failure, unspecified: Secondary | ICD-10-CM | POA: Diagnosis not present

## 2015-11-21 DIAGNOSIS — M6281 Muscle weakness (generalized): Secondary | ICD-10-CM | POA: Diagnosis not present

## 2015-11-21 DIAGNOSIS — Z7982 Long term (current) use of aspirin: Secondary | ICD-10-CM | POA: Diagnosis not present

## 2015-11-21 DIAGNOSIS — J45909 Unspecified asthma, uncomplicated: Secondary | ICD-10-CM | POA: Diagnosis not present

## 2015-11-21 DIAGNOSIS — I110 Hypertensive heart disease with heart failure: Secondary | ICD-10-CM | POA: Diagnosis not present

## 2015-11-21 DIAGNOSIS — E782 Mixed hyperlipidemia: Secondary | ICD-10-CM | POA: Diagnosis not present

## 2015-11-24 DIAGNOSIS — Z7982 Long term (current) use of aspirin: Secondary | ICD-10-CM | POA: Diagnosis not present

## 2015-11-24 DIAGNOSIS — E782 Mixed hyperlipidemia: Secondary | ICD-10-CM | POA: Diagnosis not present

## 2015-11-24 DIAGNOSIS — I509 Heart failure, unspecified: Secondary | ICD-10-CM | POA: Diagnosis not present

## 2015-11-24 DIAGNOSIS — I110 Hypertensive heart disease with heart failure: Secondary | ICD-10-CM | POA: Diagnosis not present

## 2015-11-24 DIAGNOSIS — I2510 Atherosclerotic heart disease of native coronary artery without angina pectoris: Secondary | ICD-10-CM | POA: Diagnosis not present

## 2015-11-24 DIAGNOSIS — K219 Gastro-esophageal reflux disease without esophagitis: Secondary | ICD-10-CM | POA: Diagnosis not present

## 2015-11-24 DIAGNOSIS — M6281 Muscle weakness (generalized): Secondary | ICD-10-CM | POA: Diagnosis not present

## 2015-11-24 DIAGNOSIS — E1140 Type 2 diabetes mellitus with diabetic neuropathy, unspecified: Secondary | ICD-10-CM | POA: Diagnosis not present

## 2015-11-24 DIAGNOSIS — J45909 Unspecified asthma, uncomplicated: Secondary | ICD-10-CM | POA: Diagnosis not present

## 2015-11-24 DIAGNOSIS — Z9181 History of falling: Secondary | ICD-10-CM | POA: Diagnosis not present

## 2015-11-24 DIAGNOSIS — I252 Old myocardial infarction: Secondary | ICD-10-CM | POA: Diagnosis not present

## 2015-11-25 DIAGNOSIS — I110 Hypertensive heart disease with heart failure: Secondary | ICD-10-CM | POA: Diagnosis not present

## 2015-11-25 DIAGNOSIS — J45909 Unspecified asthma, uncomplicated: Secondary | ICD-10-CM | POA: Diagnosis not present

## 2015-11-25 DIAGNOSIS — I252 Old myocardial infarction: Secondary | ICD-10-CM | POA: Diagnosis not present

## 2015-11-25 DIAGNOSIS — I509 Heart failure, unspecified: Secondary | ICD-10-CM | POA: Diagnosis not present

## 2015-11-25 DIAGNOSIS — Z9181 History of falling: Secondary | ICD-10-CM | POA: Diagnosis not present

## 2015-11-25 DIAGNOSIS — E782 Mixed hyperlipidemia: Secondary | ICD-10-CM | POA: Diagnosis not present

## 2015-11-25 DIAGNOSIS — E1140 Type 2 diabetes mellitus with diabetic neuropathy, unspecified: Secondary | ICD-10-CM | POA: Diagnosis not present

## 2015-11-25 DIAGNOSIS — K219 Gastro-esophageal reflux disease without esophagitis: Secondary | ICD-10-CM | POA: Diagnosis not present

## 2015-11-25 DIAGNOSIS — I2510 Atherosclerotic heart disease of native coronary artery without angina pectoris: Secondary | ICD-10-CM | POA: Diagnosis not present

## 2015-11-25 DIAGNOSIS — M6281 Muscle weakness (generalized): Secondary | ICD-10-CM | POA: Diagnosis not present

## 2015-11-25 DIAGNOSIS — Z7982 Long term (current) use of aspirin: Secondary | ICD-10-CM | POA: Diagnosis not present

## 2015-11-27 DIAGNOSIS — J45909 Unspecified asthma, uncomplicated: Secondary | ICD-10-CM | POA: Diagnosis not present

## 2015-11-27 DIAGNOSIS — I2510 Atherosclerotic heart disease of native coronary artery without angina pectoris: Secondary | ICD-10-CM | POA: Diagnosis not present

## 2015-11-27 DIAGNOSIS — Z7982 Long term (current) use of aspirin: Secondary | ICD-10-CM | POA: Diagnosis not present

## 2015-11-27 DIAGNOSIS — Z9181 History of falling: Secondary | ICD-10-CM | POA: Diagnosis not present

## 2015-11-27 DIAGNOSIS — I509 Heart failure, unspecified: Secondary | ICD-10-CM | POA: Diagnosis not present

## 2015-11-27 DIAGNOSIS — I110 Hypertensive heart disease with heart failure: Secondary | ICD-10-CM | POA: Diagnosis not present

## 2015-11-27 DIAGNOSIS — K219 Gastro-esophageal reflux disease without esophagitis: Secondary | ICD-10-CM | POA: Diagnosis not present

## 2015-11-27 DIAGNOSIS — E1140 Type 2 diabetes mellitus with diabetic neuropathy, unspecified: Secondary | ICD-10-CM | POA: Diagnosis not present

## 2015-11-27 DIAGNOSIS — E782 Mixed hyperlipidemia: Secondary | ICD-10-CM | POA: Diagnosis not present

## 2015-11-27 DIAGNOSIS — M6281 Muscle weakness (generalized): Secondary | ICD-10-CM | POA: Diagnosis not present

## 2015-11-27 DIAGNOSIS — I252 Old myocardial infarction: Secondary | ICD-10-CM | POA: Diagnosis not present

## 2015-12-01 DIAGNOSIS — E782 Mixed hyperlipidemia: Secondary | ICD-10-CM | POA: Diagnosis not present

## 2015-12-01 DIAGNOSIS — M6281 Muscle weakness (generalized): Secondary | ICD-10-CM | POA: Diagnosis not present

## 2015-12-01 DIAGNOSIS — Z7982 Long term (current) use of aspirin: Secondary | ICD-10-CM | POA: Diagnosis not present

## 2015-12-01 DIAGNOSIS — J45909 Unspecified asthma, uncomplicated: Secondary | ICD-10-CM | POA: Diagnosis not present

## 2015-12-01 DIAGNOSIS — E1140 Type 2 diabetes mellitus with diabetic neuropathy, unspecified: Secondary | ICD-10-CM | POA: Diagnosis not present

## 2015-12-01 DIAGNOSIS — K219 Gastro-esophageal reflux disease without esophagitis: Secondary | ICD-10-CM | POA: Diagnosis not present

## 2015-12-01 DIAGNOSIS — I252 Old myocardial infarction: Secondary | ICD-10-CM | POA: Diagnosis not present

## 2015-12-01 DIAGNOSIS — I509 Heart failure, unspecified: Secondary | ICD-10-CM | POA: Diagnosis not present

## 2015-12-01 DIAGNOSIS — I110 Hypertensive heart disease with heart failure: Secondary | ICD-10-CM | POA: Diagnosis not present

## 2015-12-01 DIAGNOSIS — I2510 Atherosclerotic heart disease of native coronary artery without angina pectoris: Secondary | ICD-10-CM | POA: Diagnosis not present

## 2015-12-01 DIAGNOSIS — Z9181 History of falling: Secondary | ICD-10-CM | POA: Diagnosis not present

## 2015-12-03 DIAGNOSIS — Z7982 Long term (current) use of aspirin: Secondary | ICD-10-CM | POA: Diagnosis not present

## 2015-12-03 DIAGNOSIS — I252 Old myocardial infarction: Secondary | ICD-10-CM | POA: Diagnosis not present

## 2015-12-03 DIAGNOSIS — I2510 Atherosclerotic heart disease of native coronary artery without angina pectoris: Secondary | ICD-10-CM | POA: Diagnosis not present

## 2015-12-03 DIAGNOSIS — K219 Gastro-esophageal reflux disease without esophagitis: Secondary | ICD-10-CM | POA: Diagnosis not present

## 2015-12-03 DIAGNOSIS — E782 Mixed hyperlipidemia: Secondary | ICD-10-CM | POA: Diagnosis not present

## 2015-12-03 DIAGNOSIS — E1140 Type 2 diabetes mellitus with diabetic neuropathy, unspecified: Secondary | ICD-10-CM | POA: Diagnosis not present

## 2015-12-03 DIAGNOSIS — I10 Essential (primary) hypertension: Secondary | ICD-10-CM | POA: Diagnosis not present

## 2015-12-03 DIAGNOSIS — J45909 Unspecified asthma, uncomplicated: Secondary | ICD-10-CM | POA: Diagnosis not present

## 2015-12-03 DIAGNOSIS — I509 Heart failure, unspecified: Secondary | ICD-10-CM | POA: Diagnosis not present

## 2015-12-03 DIAGNOSIS — Z9181 History of falling: Secondary | ICD-10-CM | POA: Diagnosis not present

## 2015-12-03 DIAGNOSIS — M6281 Muscle weakness (generalized): Secondary | ICD-10-CM | POA: Diagnosis not present

## 2015-12-03 DIAGNOSIS — I110 Hypertensive heart disease with heart failure: Secondary | ICD-10-CM | POA: Diagnosis not present

## 2015-12-10 DIAGNOSIS — I2510 Atherosclerotic heart disease of native coronary artery without angina pectoris: Secondary | ICD-10-CM | POA: Diagnosis not present

## 2015-12-10 DIAGNOSIS — I509 Heart failure, unspecified: Secondary | ICD-10-CM | POA: Diagnosis not present

## 2015-12-10 DIAGNOSIS — E782 Mixed hyperlipidemia: Secondary | ICD-10-CM | POA: Diagnosis not present

## 2015-12-10 DIAGNOSIS — E1140 Type 2 diabetes mellitus with diabetic neuropathy, unspecified: Secondary | ICD-10-CM | POA: Diagnosis not present

## 2015-12-10 DIAGNOSIS — K219 Gastro-esophageal reflux disease without esophagitis: Secondary | ICD-10-CM | POA: Diagnosis not present

## 2015-12-10 DIAGNOSIS — I252 Old myocardial infarction: Secondary | ICD-10-CM | POA: Diagnosis not present

## 2015-12-10 DIAGNOSIS — I110 Hypertensive heart disease with heart failure: Secondary | ICD-10-CM | POA: Diagnosis not present

## 2015-12-10 DIAGNOSIS — Z7982 Long term (current) use of aspirin: Secondary | ICD-10-CM | POA: Diagnosis not present

## 2015-12-10 DIAGNOSIS — M6281 Muscle weakness (generalized): Secondary | ICD-10-CM | POA: Diagnosis not present

## 2015-12-10 DIAGNOSIS — J45909 Unspecified asthma, uncomplicated: Secondary | ICD-10-CM | POA: Diagnosis not present

## 2015-12-10 DIAGNOSIS — Z9181 History of falling: Secondary | ICD-10-CM | POA: Diagnosis not present

## 2015-12-12 DIAGNOSIS — Z9181 History of falling: Secondary | ICD-10-CM | POA: Diagnosis not present

## 2015-12-12 DIAGNOSIS — K219 Gastro-esophageal reflux disease without esophagitis: Secondary | ICD-10-CM | POA: Diagnosis not present

## 2015-12-12 DIAGNOSIS — I252 Old myocardial infarction: Secondary | ICD-10-CM | POA: Diagnosis not present

## 2015-12-12 DIAGNOSIS — Z7982 Long term (current) use of aspirin: Secondary | ICD-10-CM | POA: Diagnosis not present

## 2015-12-12 DIAGNOSIS — E1140 Type 2 diabetes mellitus with diabetic neuropathy, unspecified: Secondary | ICD-10-CM | POA: Diagnosis not present

## 2015-12-12 DIAGNOSIS — M6281 Muscle weakness (generalized): Secondary | ICD-10-CM | POA: Diagnosis not present

## 2015-12-12 DIAGNOSIS — E782 Mixed hyperlipidemia: Secondary | ICD-10-CM | POA: Diagnosis not present

## 2015-12-12 DIAGNOSIS — I509 Heart failure, unspecified: Secondary | ICD-10-CM | POA: Diagnosis not present

## 2015-12-12 DIAGNOSIS — I110 Hypertensive heart disease with heart failure: Secondary | ICD-10-CM | POA: Diagnosis not present

## 2015-12-12 DIAGNOSIS — J45909 Unspecified asthma, uncomplicated: Secondary | ICD-10-CM | POA: Diagnosis not present

## 2015-12-12 DIAGNOSIS — I2510 Atherosclerotic heart disease of native coronary artery without angina pectoris: Secondary | ICD-10-CM | POA: Diagnosis not present

## 2015-12-16 ENCOUNTER — Encounter: Payer: Self-pay | Admitting: Gastroenterology

## 2015-12-18 ENCOUNTER — Ambulatory Visit: Payer: Self-pay

## 2015-12-19 DIAGNOSIS — I252 Old myocardial infarction: Secondary | ICD-10-CM | POA: Diagnosis not present

## 2015-12-19 DIAGNOSIS — I110 Hypertensive heart disease with heart failure: Secondary | ICD-10-CM | POA: Diagnosis not present

## 2015-12-19 DIAGNOSIS — E1140 Type 2 diabetes mellitus with diabetic neuropathy, unspecified: Secondary | ICD-10-CM | POA: Diagnosis not present

## 2015-12-19 DIAGNOSIS — I2510 Atherosclerotic heart disease of native coronary artery without angina pectoris: Secondary | ICD-10-CM | POA: Diagnosis not present

## 2015-12-19 DIAGNOSIS — J45909 Unspecified asthma, uncomplicated: Secondary | ICD-10-CM | POA: Diagnosis not present

## 2015-12-19 DIAGNOSIS — M6281 Muscle weakness (generalized): Secondary | ICD-10-CM | POA: Diagnosis not present

## 2015-12-19 DIAGNOSIS — Z9181 History of falling: Secondary | ICD-10-CM | POA: Diagnosis not present

## 2015-12-19 DIAGNOSIS — I509 Heart failure, unspecified: Secondary | ICD-10-CM | POA: Diagnosis not present

## 2015-12-19 DIAGNOSIS — K219 Gastro-esophageal reflux disease without esophagitis: Secondary | ICD-10-CM | POA: Diagnosis not present

## 2015-12-19 DIAGNOSIS — E782 Mixed hyperlipidemia: Secondary | ICD-10-CM | POA: Diagnosis not present

## 2015-12-19 DIAGNOSIS — Z7982 Long term (current) use of aspirin: Secondary | ICD-10-CM | POA: Diagnosis not present

## 2015-12-26 DIAGNOSIS — E782 Mixed hyperlipidemia: Secondary | ICD-10-CM | POA: Diagnosis not present

## 2015-12-26 DIAGNOSIS — Z9181 History of falling: Secondary | ICD-10-CM | POA: Diagnosis not present

## 2015-12-26 DIAGNOSIS — E1140 Type 2 diabetes mellitus with diabetic neuropathy, unspecified: Secondary | ICD-10-CM | POA: Diagnosis not present

## 2015-12-26 DIAGNOSIS — Z7982 Long term (current) use of aspirin: Secondary | ICD-10-CM | POA: Diagnosis not present

## 2015-12-26 DIAGNOSIS — M6281 Muscle weakness (generalized): Secondary | ICD-10-CM | POA: Diagnosis not present

## 2015-12-26 DIAGNOSIS — I110 Hypertensive heart disease with heart failure: Secondary | ICD-10-CM | POA: Diagnosis not present

## 2015-12-26 DIAGNOSIS — K219 Gastro-esophageal reflux disease without esophagitis: Secondary | ICD-10-CM | POA: Diagnosis not present

## 2015-12-26 DIAGNOSIS — I2510 Atherosclerotic heart disease of native coronary artery without angina pectoris: Secondary | ICD-10-CM | POA: Diagnosis not present

## 2015-12-26 DIAGNOSIS — I252 Old myocardial infarction: Secondary | ICD-10-CM | POA: Diagnosis not present

## 2015-12-26 DIAGNOSIS — J45909 Unspecified asthma, uncomplicated: Secondary | ICD-10-CM | POA: Diagnosis not present

## 2015-12-26 DIAGNOSIS — I509 Heart failure, unspecified: Secondary | ICD-10-CM | POA: Diagnosis not present

## 2016-03-09 DIAGNOSIS — I5032 Chronic diastolic (congestive) heart failure: Secondary | ICD-10-CM | POA: Diagnosis not present

## 2016-03-09 DIAGNOSIS — E1140 Type 2 diabetes mellitus with diabetic neuropathy, unspecified: Secondary | ICD-10-CM | POA: Diagnosis not present

## 2016-03-09 DIAGNOSIS — E782 Mixed hyperlipidemia: Secondary | ICD-10-CM | POA: Diagnosis not present

## 2016-03-09 DIAGNOSIS — I10 Essential (primary) hypertension: Secondary | ICD-10-CM | POA: Diagnosis not present

## 2016-03-09 DIAGNOSIS — I2510 Atherosclerotic heart disease of native coronary artery without angina pectoris: Secondary | ICD-10-CM | POA: Diagnosis not present

## 2016-03-15 ENCOUNTER — Telehealth: Payer: Self-pay | Admitting: *Deleted

## 2016-03-15 DIAGNOSIS — L97421 Non-pressure chronic ulcer of left heel and midfoot limited to breakdown of skin: Secondary | ICD-10-CM | POA: Diagnosis not present

## 2016-03-15 DIAGNOSIS — E782 Mixed hyperlipidemia: Secondary | ICD-10-CM | POA: Diagnosis not present

## 2016-03-15 DIAGNOSIS — J45909 Unspecified asthma, uncomplicated: Secondary | ICD-10-CM | POA: Diagnosis not present

## 2016-03-15 DIAGNOSIS — I110 Hypertensive heart disease with heart failure: Secondary | ICD-10-CM | POA: Diagnosis not present

## 2016-03-15 DIAGNOSIS — I5032 Chronic diastolic (congestive) heart failure: Secondary | ICD-10-CM | POA: Diagnosis not present

## 2016-03-15 DIAGNOSIS — E11621 Type 2 diabetes mellitus with foot ulcer: Secondary | ICD-10-CM | POA: Diagnosis not present

## 2016-03-15 DIAGNOSIS — Z9181 History of falling: Secondary | ICD-10-CM | POA: Diagnosis not present

## 2016-03-15 DIAGNOSIS — I2510 Atherosclerotic heart disease of native coronary artery without angina pectoris: Secondary | ICD-10-CM | POA: Diagnosis not present

## 2016-03-15 DIAGNOSIS — I252 Old myocardial infarction: Secondary | ICD-10-CM | POA: Diagnosis not present

## 2016-03-15 DIAGNOSIS — E1140 Type 2 diabetes mellitus with diabetic neuropathy, unspecified: Secondary | ICD-10-CM | POA: Diagnosis not present

## 2016-03-15 NOTE — Telephone Encounter (Signed)
OFC                  Taken 22-MAY-17 at  4:00PM by TRL ------------------------------------------------------------ Gardiner Barefoot             CID [id number]  Patient Caroline Medina      Pt's Dr NEW PT       Area Code 336 Phone# [phone] * DOB [phone]     RE NEEDS TO SCHEDULE  NEW PT APPT, DIMENTIA          NEEDS APPT ASAP, REF BY [facility]OVANT, PLS LV MSG IF N/A    Disp:Y/N   If Y = C/B If No Response In 36minutes =================

## 2016-03-18 ENCOUNTER — Telehealth: Payer: Self-pay | Admitting: *Deleted

## 2016-03-18 ENCOUNTER — Ambulatory Visit: Payer: Medicare Other | Admitting: Neurology

## 2016-03-18 DIAGNOSIS — Z9181 History of falling: Secondary | ICD-10-CM | POA: Diagnosis not present

## 2016-03-18 DIAGNOSIS — E1140 Type 2 diabetes mellitus with diabetic neuropathy, unspecified: Secondary | ICD-10-CM | POA: Diagnosis not present

## 2016-03-18 DIAGNOSIS — I2510 Atherosclerotic heart disease of native coronary artery without angina pectoris: Secondary | ICD-10-CM | POA: Diagnosis not present

## 2016-03-18 DIAGNOSIS — I5032 Chronic diastolic (congestive) heart failure: Secondary | ICD-10-CM | POA: Diagnosis not present

## 2016-03-18 DIAGNOSIS — E11621 Type 2 diabetes mellitus with foot ulcer: Secondary | ICD-10-CM | POA: Diagnosis not present

## 2016-03-18 DIAGNOSIS — I252 Old myocardial infarction: Secondary | ICD-10-CM | POA: Diagnosis not present

## 2016-03-18 DIAGNOSIS — I110 Hypertensive heart disease with heart failure: Secondary | ICD-10-CM | POA: Diagnosis not present

## 2016-03-18 DIAGNOSIS — J45909 Unspecified asthma, uncomplicated: Secondary | ICD-10-CM | POA: Diagnosis not present

## 2016-03-18 DIAGNOSIS — E782 Mixed hyperlipidemia: Secondary | ICD-10-CM | POA: Diagnosis not present

## 2016-03-18 DIAGNOSIS — L97421 Non-pressure chronic ulcer of left heel and midfoot limited to breakdown of skin: Secondary | ICD-10-CM | POA: Diagnosis not present

## 2016-03-18 NOTE — Telephone Encounter (Signed)
Message For: OFFICE               Taken 25-MAY-17 at  9:05AM by DRS ------------------------------------------------------------ Caller Kennieth Rad  [facility]        CID [id number]  Patient Caroline Medina      Pt's Dr 1ST APPT     Area Code 11 Phone# [phone] * DOB Jan 09, 2041    RE NEEDS TO CANCEL APPT AT 9AM, MOTHER HAS MENTAL    DISORDER AND IS NOT COOPERATING THIS MORNING         Disp:Y/N N If Y = C/B If No Response In 106minutes ============================================================

## 2016-03-19 ENCOUNTER — Encounter: Payer: Self-pay | Admitting: Neurology

## 2016-03-23 DIAGNOSIS — E119 Type 2 diabetes mellitus without complications: Secondary | ICD-10-CM | POA: Diagnosis not present

## 2016-03-23 DIAGNOSIS — L602 Onychogryphosis: Secondary | ICD-10-CM | POA: Diagnosis not present

## 2016-03-23 DIAGNOSIS — M216X1 Other acquired deformities of right foot: Secondary | ICD-10-CM | POA: Diagnosis not present

## 2016-03-23 DIAGNOSIS — L84 Corns and callosities: Secondary | ICD-10-CM | POA: Diagnosis not present

## 2016-03-23 DIAGNOSIS — M216X2 Other acquired deformities of left foot: Secondary | ICD-10-CM | POA: Diagnosis not present

## 2016-03-24 DIAGNOSIS — L97421 Non-pressure chronic ulcer of left heel and midfoot limited to breakdown of skin: Secondary | ICD-10-CM | POA: Diagnosis not present

## 2016-03-24 DIAGNOSIS — I5032 Chronic diastolic (congestive) heart failure: Secondary | ICD-10-CM | POA: Diagnosis not present

## 2016-03-24 DIAGNOSIS — E1140 Type 2 diabetes mellitus with diabetic neuropathy, unspecified: Secondary | ICD-10-CM | POA: Diagnosis not present

## 2016-03-24 DIAGNOSIS — J45909 Unspecified asthma, uncomplicated: Secondary | ICD-10-CM | POA: Diagnosis not present

## 2016-03-24 DIAGNOSIS — E782 Mixed hyperlipidemia: Secondary | ICD-10-CM | POA: Diagnosis not present

## 2016-03-24 DIAGNOSIS — I2510 Atherosclerotic heart disease of native coronary artery without angina pectoris: Secondary | ICD-10-CM | POA: Diagnosis not present

## 2016-03-24 DIAGNOSIS — Z9181 History of falling: Secondary | ICD-10-CM | POA: Diagnosis not present

## 2016-03-24 DIAGNOSIS — I252 Old myocardial infarction: Secondary | ICD-10-CM | POA: Diagnosis not present

## 2016-03-24 DIAGNOSIS — E11621 Type 2 diabetes mellitus with foot ulcer: Secondary | ICD-10-CM | POA: Diagnosis not present

## 2016-03-24 DIAGNOSIS — I110 Hypertensive heart disease with heart failure: Secondary | ICD-10-CM | POA: Diagnosis not present

## 2016-03-29 DIAGNOSIS — L97421 Non-pressure chronic ulcer of left heel and midfoot limited to breakdown of skin: Secondary | ICD-10-CM | POA: Diagnosis not present

## 2016-03-29 DIAGNOSIS — E11621 Type 2 diabetes mellitus with foot ulcer: Secondary | ICD-10-CM | POA: Diagnosis not present

## 2016-03-29 DIAGNOSIS — I2510 Atherosclerotic heart disease of native coronary artery without angina pectoris: Secondary | ICD-10-CM | POA: Diagnosis not present

## 2016-03-29 DIAGNOSIS — I5032 Chronic diastolic (congestive) heart failure: Secondary | ICD-10-CM | POA: Diagnosis not present

## 2016-03-29 DIAGNOSIS — E1140 Type 2 diabetes mellitus with diabetic neuropathy, unspecified: Secondary | ICD-10-CM | POA: Diagnosis not present

## 2016-03-29 DIAGNOSIS — E782 Mixed hyperlipidemia: Secondary | ICD-10-CM | POA: Diagnosis not present

## 2016-03-29 DIAGNOSIS — Z9181 History of falling: Secondary | ICD-10-CM | POA: Diagnosis not present

## 2016-03-29 DIAGNOSIS — I110 Hypertensive heart disease with heart failure: Secondary | ICD-10-CM | POA: Diagnosis not present

## 2016-03-29 DIAGNOSIS — J45909 Unspecified asthma, uncomplicated: Secondary | ICD-10-CM | POA: Diagnosis not present

## 2016-03-29 DIAGNOSIS — I252 Old myocardial infarction: Secondary | ICD-10-CM | POA: Diagnosis not present

## 2016-04-08 DIAGNOSIS — J45909 Unspecified asthma, uncomplicated: Secondary | ICD-10-CM | POA: Diagnosis not present

## 2016-04-08 DIAGNOSIS — E782 Mixed hyperlipidemia: Secondary | ICD-10-CM | POA: Diagnosis not present

## 2016-04-08 DIAGNOSIS — I2510 Atherosclerotic heart disease of native coronary artery without angina pectoris: Secondary | ICD-10-CM | POA: Diagnosis not present

## 2016-04-08 DIAGNOSIS — I252 Old myocardial infarction: Secondary | ICD-10-CM | POA: Diagnosis not present

## 2016-04-08 DIAGNOSIS — E11621 Type 2 diabetes mellitus with foot ulcer: Secondary | ICD-10-CM | POA: Diagnosis not present

## 2016-04-08 DIAGNOSIS — I110 Hypertensive heart disease with heart failure: Secondary | ICD-10-CM | POA: Diagnosis not present

## 2016-04-08 DIAGNOSIS — L97421 Non-pressure chronic ulcer of left heel and midfoot limited to breakdown of skin: Secondary | ICD-10-CM | POA: Diagnosis not present

## 2016-04-08 DIAGNOSIS — Z9181 History of falling: Secondary | ICD-10-CM | POA: Diagnosis not present

## 2016-04-08 DIAGNOSIS — I5032 Chronic diastolic (congestive) heart failure: Secondary | ICD-10-CM | POA: Diagnosis not present

## 2016-04-08 DIAGNOSIS — E1140 Type 2 diabetes mellitus with diabetic neuropathy, unspecified: Secondary | ICD-10-CM | POA: Diagnosis not present

## 2016-04-15 DIAGNOSIS — I5032 Chronic diastolic (congestive) heart failure: Secondary | ICD-10-CM | POA: Diagnosis not present

## 2016-04-15 DIAGNOSIS — I110 Hypertensive heart disease with heart failure: Secondary | ICD-10-CM | POA: Diagnosis not present

## 2016-04-15 DIAGNOSIS — I252 Old myocardial infarction: Secondary | ICD-10-CM | POA: Diagnosis not present

## 2016-04-15 DIAGNOSIS — J45909 Unspecified asthma, uncomplicated: Secondary | ICD-10-CM | POA: Diagnosis not present

## 2016-04-15 DIAGNOSIS — L97421 Non-pressure chronic ulcer of left heel and midfoot limited to breakdown of skin: Secondary | ICD-10-CM | POA: Diagnosis not present

## 2016-04-15 DIAGNOSIS — Z9181 History of falling: Secondary | ICD-10-CM | POA: Diagnosis not present

## 2016-04-15 DIAGNOSIS — E11621 Type 2 diabetes mellitus with foot ulcer: Secondary | ICD-10-CM | POA: Diagnosis not present

## 2016-04-15 DIAGNOSIS — E782 Mixed hyperlipidemia: Secondary | ICD-10-CM | POA: Diagnosis not present

## 2016-04-15 DIAGNOSIS — I2510 Atherosclerotic heart disease of native coronary artery without angina pectoris: Secondary | ICD-10-CM | POA: Diagnosis not present

## 2016-04-15 DIAGNOSIS — E1140 Type 2 diabetes mellitus with diabetic neuropathy, unspecified: Secondary | ICD-10-CM | POA: Diagnosis not present

## 2016-04-19 DIAGNOSIS — I110 Hypertensive heart disease with heart failure: Secondary | ICD-10-CM | POA: Diagnosis not present

## 2016-04-19 DIAGNOSIS — E782 Mixed hyperlipidemia: Secondary | ICD-10-CM | POA: Diagnosis not present

## 2016-04-19 DIAGNOSIS — E1140 Type 2 diabetes mellitus with diabetic neuropathy, unspecified: Secondary | ICD-10-CM | POA: Diagnosis not present

## 2016-04-19 DIAGNOSIS — I252 Old myocardial infarction: Secondary | ICD-10-CM | POA: Diagnosis not present

## 2016-04-19 DIAGNOSIS — I5032 Chronic diastolic (congestive) heart failure: Secondary | ICD-10-CM | POA: Diagnosis not present

## 2016-04-19 DIAGNOSIS — J45909 Unspecified asthma, uncomplicated: Secondary | ICD-10-CM | POA: Diagnosis not present

## 2016-04-19 DIAGNOSIS — L97421 Non-pressure chronic ulcer of left heel and midfoot limited to breakdown of skin: Secondary | ICD-10-CM | POA: Diagnosis not present

## 2016-04-19 DIAGNOSIS — E11621 Type 2 diabetes mellitus with foot ulcer: Secondary | ICD-10-CM | POA: Diagnosis not present

## 2016-04-19 DIAGNOSIS — I2510 Atherosclerotic heart disease of native coronary artery without angina pectoris: Secondary | ICD-10-CM | POA: Diagnosis not present

## 2016-04-19 DIAGNOSIS — Z9181 History of falling: Secondary | ICD-10-CM | POA: Diagnosis not present

## 2016-04-20 DIAGNOSIS — I252 Old myocardial infarction: Secondary | ICD-10-CM | POA: Diagnosis not present

## 2016-04-20 DIAGNOSIS — E782 Mixed hyperlipidemia: Secondary | ICD-10-CM | POA: Diagnosis not present

## 2016-04-20 DIAGNOSIS — L97421 Non-pressure chronic ulcer of left heel and midfoot limited to breakdown of skin: Secondary | ICD-10-CM | POA: Diagnosis not present

## 2016-04-20 DIAGNOSIS — I2510 Atherosclerotic heart disease of native coronary artery without angina pectoris: Secondary | ICD-10-CM | POA: Diagnosis not present

## 2016-04-20 DIAGNOSIS — E1140 Type 2 diabetes mellitus with diabetic neuropathy, unspecified: Secondary | ICD-10-CM | POA: Diagnosis not present

## 2016-04-20 DIAGNOSIS — J45909 Unspecified asthma, uncomplicated: Secondary | ICD-10-CM | POA: Diagnosis not present

## 2016-04-20 DIAGNOSIS — I5032 Chronic diastolic (congestive) heart failure: Secondary | ICD-10-CM | POA: Diagnosis not present

## 2016-04-20 DIAGNOSIS — Z9181 History of falling: Secondary | ICD-10-CM | POA: Diagnosis not present

## 2016-04-20 DIAGNOSIS — I110 Hypertensive heart disease with heart failure: Secondary | ICD-10-CM | POA: Diagnosis not present

## 2016-04-20 DIAGNOSIS — E11621 Type 2 diabetes mellitus with foot ulcer: Secondary | ICD-10-CM | POA: Diagnosis not present

## 2016-04-22 DIAGNOSIS — I5032 Chronic diastolic (congestive) heart failure: Secondary | ICD-10-CM | POA: Diagnosis not present

## 2016-04-22 DIAGNOSIS — E11621 Type 2 diabetes mellitus with foot ulcer: Secondary | ICD-10-CM | POA: Diagnosis not present

## 2016-04-22 DIAGNOSIS — E782 Mixed hyperlipidemia: Secondary | ICD-10-CM | POA: Diagnosis not present

## 2016-04-22 DIAGNOSIS — I252 Old myocardial infarction: Secondary | ICD-10-CM | POA: Diagnosis not present

## 2016-04-22 DIAGNOSIS — I2510 Atherosclerotic heart disease of native coronary artery without angina pectoris: Secondary | ICD-10-CM | POA: Diagnosis not present

## 2016-04-22 DIAGNOSIS — L97421 Non-pressure chronic ulcer of left heel and midfoot limited to breakdown of skin: Secondary | ICD-10-CM | POA: Diagnosis not present

## 2016-04-22 DIAGNOSIS — E1140 Type 2 diabetes mellitus with diabetic neuropathy, unspecified: Secondary | ICD-10-CM | POA: Diagnosis not present

## 2016-04-22 DIAGNOSIS — J45909 Unspecified asthma, uncomplicated: Secondary | ICD-10-CM | POA: Diagnosis not present

## 2016-04-22 DIAGNOSIS — I110 Hypertensive heart disease with heart failure: Secondary | ICD-10-CM | POA: Diagnosis not present

## 2016-04-22 DIAGNOSIS — Z9181 History of falling: Secondary | ICD-10-CM | POA: Diagnosis not present

## 2016-04-26 ENCOUNTER — Ambulatory Visit: Payer: Self-pay | Admitting: Physician Assistant

## 2016-04-26 DIAGNOSIS — R0989 Other specified symptoms and signs involving the circulatory and respiratory systems: Secondary | ICD-10-CM

## 2016-04-27 DIAGNOSIS — Z9181 History of falling: Secondary | ICD-10-CM | POA: Diagnosis not present

## 2016-04-27 DIAGNOSIS — I252 Old myocardial infarction: Secondary | ICD-10-CM | POA: Diagnosis not present

## 2016-04-27 DIAGNOSIS — E782 Mixed hyperlipidemia: Secondary | ICD-10-CM | POA: Diagnosis not present

## 2016-04-27 DIAGNOSIS — I5032 Chronic diastolic (congestive) heart failure: Secondary | ICD-10-CM | POA: Diagnosis not present

## 2016-04-27 DIAGNOSIS — J45909 Unspecified asthma, uncomplicated: Secondary | ICD-10-CM | POA: Diagnosis not present

## 2016-04-27 DIAGNOSIS — E1140 Type 2 diabetes mellitus with diabetic neuropathy, unspecified: Secondary | ICD-10-CM | POA: Diagnosis not present

## 2016-04-27 DIAGNOSIS — L97421 Non-pressure chronic ulcer of left heel and midfoot limited to breakdown of skin: Secondary | ICD-10-CM | POA: Diagnosis not present

## 2016-04-27 DIAGNOSIS — E11621 Type 2 diabetes mellitus with foot ulcer: Secondary | ICD-10-CM | POA: Diagnosis not present

## 2016-04-27 DIAGNOSIS — I2510 Atherosclerotic heart disease of native coronary artery without angina pectoris: Secondary | ICD-10-CM | POA: Diagnosis not present

## 2016-04-27 DIAGNOSIS — I110 Hypertensive heart disease with heart failure: Secondary | ICD-10-CM | POA: Diagnosis not present

## 2016-04-29 ENCOUNTER — Encounter: Payer: Self-pay | Admitting: Neurology

## 2016-04-29 ENCOUNTER — Ambulatory Visit (INDEPENDENT_AMBULATORY_CARE_PROVIDER_SITE_OTHER): Payer: Medicare Other | Admitting: Neurology

## 2016-04-29 VITALS — BP 156/81 | HR 62 | Ht 67.0 in | Wt 166.0 lb

## 2016-04-29 DIAGNOSIS — G301 Alzheimer's disease with late onset: Secondary | ICD-10-CM | POA: Diagnosis not present

## 2016-04-29 DIAGNOSIS — F0280 Dementia in other diseases classified elsewhere without behavioral disturbance: Secondary | ICD-10-CM | POA: Insufficient documentation

## 2016-04-29 DIAGNOSIS — R413 Other amnesia: Secondary | ICD-10-CM | POA: Diagnosis not present

## 2016-04-29 DIAGNOSIS — F0281 Dementia in other diseases classified elsewhere with behavioral disturbance: Secondary | ICD-10-CM | POA: Diagnosis not present

## 2016-04-29 DIAGNOSIS — G309 Alzheimer's disease, unspecified: Secondary | ICD-10-CM

## 2016-04-29 MED ORDER — DONEPEZIL HCL 10 MG PO TABS: 10.0000 mg | ORAL_TABLET | Freq: Every day | ORAL | Status: DC

## 2016-04-29 NOTE — Patient Instructions (Signed)
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 I recommend we check lab work, EEG and MRI scan to look for treatable causes first. Start Aricept 5 mg daily with food for one month to be increased to 10 mg daily. I have discussed possible side effects with the patient and son and answered questions. The patient clearly needs more supervision at home and her medications need to be supervised. The patient's sons understands and states he will make these provisions. Patient is reluctant to move into an assisted living. She will return for follow-up in 2 months or call earlier if necessary.  Alzheimer Disease Alzheimer disease is a mental disorder. It causes memory loss and loss of other mental functions, such as learning, thinking, problem solving, communicating, and completing tasks. The mental losses interfere with the ability to perform daily activities at work, at home, or in social situations. Alzheimer disease usually starts in a person's late 61s or early 22s but can start earlier in life (familial form). The mental changes caused by this disease are permanent and worsen over time. As the illness progresses, the ability to do even the simplest things is lost. Survival with Alzheimer disease ranges from several years to as long as 20 years. CAUSES Alzheimer disease is caused by abnormally high levels of a protein (beta-amyloid) in the brain. This protein forms very small deposits within and around the brain's nerve cells. These deposits prevent the nerve cells from working properly. Experts are not certain what causes the beta-amyloid deposits in this disease. RISK FACTORS The following major risk factors have been identified:  Increasing age.  Certain genetic variations, such as Down syndrome (trisomy 21). SYMPTOMS In the early stages of Alzheimer disease, you are still  able to perform daily activities but need greater effort, more time, or memory aids. Early symptoms include:  Mild memory loss of recent events, names, or phone numbers.  Loss of objects.  Minor loss of vocabulary.  Difficulty with complex tasks, such as paying bills or driving in unfamiliar locations. Other mental functions deteriorate as the disease worsens. These changes slowly go from mild to severe. Symptoms at this stage include:  Difficulty remembering. You may not be able to recall personal information such as your address and telephone number. You may become confused about the date, the season of the year, or your location.  Difficulty maintaining attention. You may forget what you wanted to say during conversations and repeat what you have already said.  Difficulty learning new information or tasks. You may not remember what you read or the name of a new friend you met.  Difficulty counting or doing math. You may have difficulty with complex math problems. You may make mistakes in paying bills or managing your checkbook.  Poor reasoning and judgment. You may make poor decisions or not dress right for the weather.  Difficulty communicating. You may have regular difficulty remembering words, naming objects, expressing yourself clearly, or writing sentences that make sense.  Difficulty performing familiar daily activities. You may get lost driving in familiar locations or need help eating, bathing, dressing, grooming, or using the toilet. You may have difficulty maintaining bladder or bowel control.  Difficulty recognizing familiar faces. You may confuse family members or close friends with one another. You may not recognize a close relative or may mistake strangers for family. Alzheimer disease also may cause changes in  personality and behavior. These changes include:   Loss of interest or motivation.  Social withdrawal.  Anxiety.  Difficulty sleeping.  Uncharacteristic  anger or combativeness.  A false belief that someone is trying to harm you (paranoia).  Seeing things that are not real (hallucinations).  Agitation. Confusion and disruptive behavior are often worse at night and may be triggered by changes in the environment or acute medical issues. DIAGNOSIS  Alzheimer disease is diagnosed through an assessment by your health care provider. During this assessment, your health care provider will do the following:  Ask you and your family, friends, or caregivers questions about your symptoms, their frequency, their duration and progression, and the effect they are having on your life.  Ask questions about your personal and family medical history and use of alcohol or drugs, including prescription medicine.  Perform a physical exam and order blood tests and brain imaging exams. Your health care provider may refer you to a specialist for detailed evaluation of your mental functions (neuropsychological testing).  Many different brain disorders, medical conditions, and certain substances can cause symptoms that resemble Alzheimer disease symptoms. These must be ruled out before this disease can be diagnosed. If Alzheimer disease is diagnosed, it will be considered either "possible" or "probable" Alzheimer disease. "Possible" Alzheimer disease means that your symptoms are typical of the disease and no other disorder is causing them. "Probable" Alzheimer disease means that you also have a family history of the disease or genetic test results that support the diagnosis. Certain tests, mostly used in research studies, are highly specific for Alzheimer disease.  TREATMENT  There is currently no cure for this disease. The goals of treatment are to:  Slow down the progression of the disease.  Preserve mental function as long as possible.  Manage behavioral symptoms.  Make life easier for the person with Alzheimer disease and his or her caregivers. The following  treatment options are available:  Medicine. Certain medicines may help slow memory loss by changing the level of certain chemicals in the brain. Medicine may also help with behavioral symptoms.  Talk therapy. Talk therapy provides education, support, and memory aids for people with this disease. It is most effective in the early stages of the illness.  Caregiving. Caregivers may be family members, friends, or trained medical professionals. They help the person with Alzheimer disease with daily life activities. Caregiving may take place at home or at a nursing facility.  Family support groups. These provide education, emotional support, and information about community resources to family members who are taking care of the person with this disease.   This information is not intended to replace advice given to you by your health care provider. Make sure you discuss any questions you have with your health care provider.   Document Released: 06/22/2004 Document Revised: 11/01/2014 Document Reviewed: 02/16/2013 Elsevier Interactive Patient Education [employer].

## 2016-04-29 NOTE — Progress Notes (Signed)
[facility] [address] [place]. [place] [address] [phone]       OFFICE CONSULT NOTE  Ms. FATOUMATTA HARDINGER Date of Birth:  20-Apr-1941 Medical Record Number:  [id number]   Referring MD:  Orie Fisherman, PA-c  Reason for Referral:  Dementia  HPI: Ms Hinkley is a 37 year [place] lady who is accompanied today by her son who provides most of the history. Referral notes from primary care physician also reviewed. Patient has had a progressive cognitive decline for at least a year or so. This has been progressive. He first noticed this when he was on a trip with her to [place] where it was very difficult for her to adapt to changes and learned new information. Patient often repeats herself and has to be constantly told the same information over and over but does not remember. She at times has been found wandering out of her house onto a busy highway. She is not able to handle the finances and leaves money and stuff in her house spread all over. She often misplaces objects and cannot find them. She believes the television lights on and [place] running. Patient has also had behavioral agitation and she is stubborn to any changes. Son has to help her out more and more. She still lives by herself. She has not had any recent brain imaging studies done. Or tried any dementia medications. Review of referral notes reveal comprehensive metabolic panel done on [id number] being unremarkable except elevated glucose of 292 mg percent. Total cholesterol was 214, LDL 143. Albumin A1c was 11.9. Patient has not had any episodes of loss of consciousness, seizures, strokes, TIA. She does have multiple medical problems including diabetes hyper lipidemia, hypertension, congestive heart failure, recurrent falls. There is no family history of Alzheimer's dementia.  ROS:   14 system review of systems is positive for weight loss, fatigue, shortness of breath, cough, urination problems, incontinence,  joint pain, aching muscles, memory loss, confusion and all other systems negative.  PMH:  Past Medical History  Diagnosis Date  . Diabetes mellitus   . Hyperlipidemia   . Kidney stones   . Essential hypertension   . GERD (gastroesophageal reflux disease)   . Asthma 10/14/2013  . Adrenal adenoma (bilateral) 10/14/2013  . Obesity (BMI 30-39.9) 10/14/2013  . Depressive disorder, not elsewhere classified   . Epistaxis     allergic rhinnitis plus plavix  . Chronic diastolic CHF (congestive heart failure) ([place])   . COPD (chronic obstructive pulmonary disease) ([place])   . Coronary artery disease     a. with RCA DES 09/2007. b. NSTEMI 09/2013 s/p DES to Cx with residual moderate LAD/RCA stenosis, EF 55%.  . Memory loss     Social History:  Social History   Social History  . Marital Status: Legally Separated    Spouse Name: N/A  . Number of Children: N/A  . Years of Education: N/A   Occupational History  . Not on file.   Social History Main Topics  . Smoking status: Current Every Day Smoker -- 1.00 packs/day for 50 years    Types: Cigarettes  . Smokeless tobacco: Never Used  . Alcohol Use: 0.0 oz/week  . Drug Use: No  . Sexual Activity: No   Other Topics Concern  . Not on file   Social History Narrative   Widow          Medications:   Current Outpatient Prescriptions on File Prior to Visit  Medication Sig Dispense Refill  .  amitriptyline (ELAVIL) 25 MG tablet Take 1 tablet (25 mg total) by mouth at bedtime.    Marland Kitchen aspirin 81 MG tablet Take 81 mg by mouth daily.      . cloNIDine (CATAPRES) 0.1 MG tablet Take by mouth 2 (two) times daily.      Marland Kitchen diltiazem (DILACOR XR) 240 MG 24 hr capsule     . folic acid (FOLVITE) 1 MG tablet Take 1 mg by mouth daily.    Marland Kitchen gabapentin (NEURONTIN) 300 MG capsule Take 300 mg by mouth 2 (two) times daily.    Marland Kitchen glipizide-metformin (METAGLIP) 2.5-250 MG per tablet Take 4 tablets by mouth daily.    Marland Kitchen losartan (COZAAR) 100 MG tablet     .  Multiple Vitamins-Minerals (MULTIVITAMIN WITH MINERALS) tablet Take 1 tablet by mouth daily.    . nitroGLYCERIN (NITROSTAT) 0.4 MG SL tablet Place 1 tablet (0.4 mg total) under the tongue every 5 (five) minutes as needed for chest pain. 25 tablet 12  . omeprazole (PRILOSEC) 20 MG capsule Take 1 capsule by mouth Daily.     No current facility-administered medications on file prior to visit.    Allergies:   Allergies  Allergen Reactions  . Lisinopril   . Penicillins Rash    Physical Exam General: Frail elderly African-American lady, seated, in no evident distress Head: head normocephalic and atraumatic.   Neck: supple with no carotid or supraclavicular bruits Cardiovascular: regular rate and rhythm, no murmurs Musculoskeletal: no deformity Skin:  no rash/petichiae Vascular:  Normal pulses all extremities  Neurologic Exam Mental Status: Awake and fully alert. Oriented to place and time. Recent and remote memory Poor. Attention span, concentration and fund of knowledge diminished. Mood and affect appropriate. Mini-Mental status exam scored 15/30 with deficits in orientation, attention, calculation and recall. Unable to clock drawing 2/4. Cranial Nerves: Fundoscopic exam reveals sharp disc margins. Pupils equal, briskly reactive to light. Extraocular movements full without nystagmus. Visual fields full to confrontation. Hearing intact. Facial sensation intact. Face, tongue, palate moves normally and symmetrically.  Motor: Normal bulk and tone. Normal strength in all tested extremity muscles. Sensory.: intact to touch , pinprick , position and vibratory sensation.  Coordination: Rapid alternating movements normal in all extremities. Finger-to-nose and heel-to-shin performed accurately bilaterally. Gait and Station: Arises from chair without difficulty. Stance is slightly stooped Gait demonstrates normal stride length and mild imbalance . Able to heel, toe and tandem walk without difficulty.    Reflexes: 1+ and symmetric. Toes downgoing.       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    PLAN: I had a long discussion the patient and son with regards to her progressive cognitive and memory deterioration over the last 1 year likely suggestive of Alzheimer's dementia. I recommend we check lab work, EEG and MRI scan to look for treatable causes first. Start Aricept 5 mg daily with food for one month to be increased to 10 mg daily. I have discussed possible side effects with the patient and son and answered questions. The patient clearly needs more supervision at home and her medications need to be supervised. The patient's sons understands and states he will make these provisions. Patient is reluctant to move into an assisted living. She will return for follow-up in 2 months or call earlier if necessary. Greater than 50% time during this 45 minute consultation was spent on counseling and coordination of care about dementia and answering questions Antony Contras, MD  [facility] [address] [place], [place] [address]  Phone [phone] Fax [phone]: This document was prepared with digital dictation and possible smart phrase technology. Any transcriptional errors that result from this process are unintentional.

## 2016-04-30 ENCOUNTER — Encounter (HOSPITAL_COMMUNITY): Payer: Self-pay | Admitting: Emergency Medicine

## 2016-04-30 DIAGNOSIS — I5032 Chronic diastolic (congestive) heart failure: Secondary | ICD-10-CM | POA: Insufficient documentation

## 2016-04-30 DIAGNOSIS — M79622 Pain in left upper arm: Secondary | ICD-10-CM | POA: Diagnosis not present

## 2016-04-30 DIAGNOSIS — I2510 Atherosclerotic heart disease of native coronary artery without angina pectoris: Secondary | ICD-10-CM | POA: Insufficient documentation

## 2016-04-30 DIAGNOSIS — Z7982 Long term (current) use of aspirin: Secondary | ICD-10-CM | POA: Diagnosis not present

## 2016-04-30 DIAGNOSIS — E669 Obesity, unspecified: Secondary | ICD-10-CM | POA: Insufficient documentation

## 2016-04-30 DIAGNOSIS — E1165 Type 2 diabetes mellitus with hyperglycemia: Secondary | ICD-10-CM | POA: Diagnosis not present

## 2016-04-30 DIAGNOSIS — I110 Hypertensive heart disease with heart failure: Secondary | ICD-10-CM | POA: Insufficient documentation

## 2016-04-30 DIAGNOSIS — Z7984 Long term (current) use of oral hypoglycemic drugs: Secondary | ICD-10-CM | POA: Insufficient documentation

## 2016-04-30 DIAGNOSIS — F17210 Nicotine dependence, cigarettes, uncomplicated: Secondary | ICD-10-CM | POA: Insufficient documentation

## 2016-04-30 DIAGNOSIS — E11649 Type 2 diabetes mellitus with hypoglycemia without coma: Secondary | ICD-10-CM | POA: Insufficient documentation

## 2016-04-30 DIAGNOSIS — R0602 Shortness of breath: Secondary | ICD-10-CM | POA: Insufficient documentation

## 2016-04-30 DIAGNOSIS — Z6839 Body mass index (BMI) 39.0-39.9, adult: Secondary | ICD-10-CM | POA: Diagnosis not present

## 2016-04-30 DIAGNOSIS — Z79899 Other long term (current) drug therapy: Secondary | ICD-10-CM | POA: Diagnosis not present

## 2016-04-30 DIAGNOSIS — E785 Hyperlipidemia, unspecified: Secondary | ICD-10-CM | POA: Insufficient documentation

## 2016-04-30 DIAGNOSIS — J449 Chronic obstructive pulmonary disease, unspecified: Secondary | ICD-10-CM | POA: Insufficient documentation

## 2016-04-30 DIAGNOSIS — M79602 Pain in left arm: Secondary | ICD-10-CM | POA: Diagnosis not present

## 2016-04-30 LAB — DEMENTIA PANEL
Homocysteine: 14 umol/L (ref 0.0–15.0)
RPR: NONREACTIVE
TSH: 1.59 u[IU]/mL (ref 0.450–4.500)
VITAMIN B 12: 582 pg/mL (ref 211–946)

## 2016-04-30 MED ORDER — ALBUTEROL SULFATE (2.5 MG/3ML) 0.083% IN NEBU
5.0000 mg | INHALATION_SOLUTION | Freq: Once | RESPIRATORY_TRACT | Status: AC
  Administered 2016-05-01: 5 mg via RESPIRATORY_TRACT
  Filled 2016-04-30: qty 6

## 2016-04-30 NOTE — ED Notes (Signed)
Patient here from home, recently diagnosed with dementia.  Patient's son states she has been complaining of left arm pain for a week, shortness of breath.  She denies any chest pain at this time.  Son states that he takes care of her at home.

## 2016-05-01 ENCOUNTER — Emergency Department (HOSPITAL_COMMUNITY): Payer: Medicare Other

## 2016-05-01 ENCOUNTER — Emergency Department (HOSPITAL_COMMUNITY)
Admission: EM | Admit: 2016-05-01 | Discharge: 2016-05-01 | Disposition: A | Discharge status: Home / Self Care | Payer: Medicare Other | Attending: Emergency Medicine | Admitting: Emergency Medicine

## 2016-05-01 DIAGNOSIS — R0602 Shortness of breath: Secondary | ICD-10-CM | POA: Diagnosis not present

## 2016-05-01 DIAGNOSIS — M79602 Pain in left arm: Secondary | ICD-10-CM

## 2016-05-01 DIAGNOSIS — R739 Hyperglycemia, unspecified: Secondary | ICD-10-CM

## 2016-05-01 LAB — COMPREHENSIVE METABOLIC PANEL
ALBUMIN: 3.6 g/dL (ref 3.5–5.0)
ALT: 14 U/L (ref 14–54)
AST: 12 U/L — AB (ref 15–41)
Alkaline Phosphatase: 81 U/L (ref 38–126)
Anion gap: 6 (ref 5–15)
BUN: 16 mg/dL (ref 6–20)
CHLORIDE: 105 mmol/L (ref 101–111)
CO2: 26 mmol/L (ref 22–32)
Calcium: 9.3 mg/dL (ref 8.9–10.3)
Creatinine, Ser: 0.99 mg/dL (ref 0.44–1.00)
GFR calc Af Amer: >60 mL/min (ref >60)
GFR calc non Af Amer: 55 mL/min — ABNORMAL LOW (ref >60)
GLUCOSE: 403 mg/dL — AB (ref 65–99)
POTASSIUM: 4 mmol/L (ref 3.5–5.1)
Sodium: 137 mmol/L (ref 135–145)
Total Bilirubin: 0.5 mg/dL (ref 0.3–1.2)
Total Protein: 7.2 g/dL (ref 6.5–8.1)

## 2016-05-01 LAB — CBC WITH DIFFERENTIAL/PLATELET
BASOS ABS: 0 10*3/uL (ref 0.0–0.1)
BASOS PCT: 0 %
Eosinophils Absolute: 0.1 10*3/uL (ref 0.0–0.7)
Eosinophils Relative: 1 %
HEMATOCRIT: 46.1 % — AB (ref 36.0–46.0)
Hemoglobin: 15.5 g/dL — ABNORMAL HIGH (ref 12.0–15.0)
Lymphocytes Relative: 25 %
Lymphs Abs: 2.3 10*3/uL (ref 0.7–4.0)
MCH: 29.9 pg (ref 26.0–34.0)
MCHC: 33.6 g/dL (ref 30.0–36.0)
MCV: 89 fL (ref 78.0–100.0)
MONO ABS: 0.5 10*3/uL (ref 0.1–1.0)
Monocytes Relative: 6 %
NEUTROS ABS: 6.3 10*3/uL (ref 1.7–7.7)
Neutrophils Relative %: 68 %
PLATELETS: 265 10*3/uL (ref 150–400)
RBC: 5.18 MIL/uL — AB (ref 3.87–5.11)
RDW: 13.4 % (ref 11.5–15.5)
WBC: 9.2 10*3/uL (ref 4.0–10.5)

## 2016-05-01 LAB — CBG MONITORING, ED
GLUCOSE-CAPILLARY: 393 mg/dL — AB (ref 65–99)
Glucose-Capillary: 309 mg/dL — ABNORMAL HIGH (ref 65–99)

## 2016-05-01 LAB — I-STAT TROPONIN, ED: Troponin i, poc: 0 ng/mL (ref 0.00–0.08)

## 2016-05-01 MED ORDER — ACETAMINOPHEN 500 MG PO TABS: 500.0000 mg | ORAL_TABLET | Freq: Four times a day (QID) | ORAL | Status: AC | PRN

## 2016-05-01 MED ORDER — SODIUM CHLORIDE 0.9 % IV BOLUS (SEPSIS)
1000.0000 mL | Freq: Once | INTRAVENOUS | Status: AC
  Administered 2016-05-01: 1000 mL via INTRAVENOUS

## 2016-05-01 NOTE — Discharge Instructions (Signed)
You have evaluated today for shortness of breath and arm pain. Your evaluation is reassuring. No signs of pneumonia and her vital signs have been reassuring while in the emergency room. Your glucose was noted be elevated. Make sure that you are taking your diabetes medications as directed.  Shortness of Breath Shortness of breath means you have trouble breathing. It could also mean that you have a medical problem. You should get immediate medical care for shortness of breath. CAUSES   Not enough oxygen in the air such as with high altitudes or a smoke-filled room.  Certain lung diseases, infections, or problems.  Heart disease or conditions, such as angina or heart failure.  Low red blood cells (anemia).  Poor physical fitness, which can cause shortness of breath when you exercise.  Chest or back injuries or stiffness.  Being overweight.  Smoking.  Anxiety, which can make you feel like you are not getting enough air. DIAGNOSIS  Serious medical problems can often be found during your physical exam. Tests may also be done to determine why you are having shortness of breath. Tests may include:  Chest X-rays.  Lung function tests.  Blood tests.  An electrocardiogram (ECG).  An ambulatory electrocardiogram. An ambulatory ECG records your heartbeat patterns over a 24-hour period.  Exercise testing.  A transthoracic echocardiogram (TTE). During echocardiography, sound waves are used to evaluate how blood flows through your heart.  A transesophageal echocardiogram (TEE).  Imaging scans. Your health care provider may not be able to find a cause for your shortness of breath after your exam. In this case, it is important to have a follow-up exam with your health care provider as directed.  TREATMENT  Treatment for shortness of breath depends on the cause of your symptoms and can vary greatly. HOME CARE INSTRUCTIONS   Do not smoke. Smoking is a common cause of shortness of breath.  If you smoke, ask for help to quit.  Avoid being around chemicals or things that may bother your breathing, such as paint fumes and dust.  Rest as needed. Slowly resume your usual activities.  If medicines were prescribed, take them as directed for the full length of time directed. This includes oxygen and any inhaled medicines.  Keep all follow-up appointments as directed by your health care provider. SEEK MEDICAL CARE IF:   Your condition does not improve in the time expected.  You have a hard time doing your normal activities even with rest.  You have any new symptoms. SEEK IMMEDIATE MEDICAL CARE IF:   Your shortness of breath gets worse.  You feel light-headed, faint, or develop a cough not controlled with medicines.  You start coughing up blood.  You have pain with breathing.  You have chest pain or pain in your arms, shoulders, or abdomen.  You have a fever.  You are unable to walk up stairs or exercise the way you normally do. MAKE SURE YOU:  Understand these instructions.  Will watch your condition.  Will get help right away if you are not doing well or get worse.   This information is not intended to replace advice given to you by your health care provider. Make sure you discuss any questions you have with your health care provider.   Document Released: 07/06/2001 Document Revised: 10/16/2013 Document Reviewed: 12/27/2011 Elsevier Interactive Patient Education 2016 [place]. Hyperglycemia Hyperglycemia occurs when the glucose (sugar) in your blood is too high. Hyperglycemia can happen for many reasons, but it most often happens  to people who do not know they have diabetes or are not managing their diabetes properly.  CAUSES  Whether you have diabetes or not, there are other causes of hyperglycemia. Hyperglycemia can occur when you have diabetes, but it can also occur in other situations that you might not be as aware of, such as: Diabetes  If you have  diabetes and are having problems controlling your blood glucose, hyperglycemia could occur because of some of the following reasons:  Not following your meal plan.  Not taking your diabetes medications or not taking it properly.  Exercising less or doing less activity than you normally do.  Being sick. Pre-diabetes  This cannot be ignored. Before people develop Type 2 diabetes, they almost always have "pre-diabetes." This is when your blood glucose levels are higher than normal, but not yet high enough to be diagnosed as diabetes. Research has shown that some long-term damage to the body, especially the heart and circulatory system, may already be occurring during pre-diabetes. If you take action to manage your blood glucose when you have pre-diabetes, you may delay or prevent Type 2 diabetes from developing. Stress  If you have diabetes, you may be "diet" controlled or on oral medications or insulin to control your diabetes. However, you may find that your blood glucose is higher than usual in the hospital whether you have diabetes or not. This is often referred to as "stress hyperglycemia." Stress can elevate your blood glucose. This happens because of hormones put out by the body during times of stress. If stress has been the cause of your high blood glucose, it can be followed regularly by your caregiver. That way he/she can make sure your hyperglycemia does not continue to get worse or progress to diabetes. Steroids  Steroids are medications that act on the infection fighting system (immune system) to block inflammation or infection. One side effect can be a rise in blood glucose. Most people can produce enough extra insulin to allow for this rise, but for those who cannot, steroids make blood glucose levels go even higher. It is not unusual for steroid treatments to "uncover" diabetes that is developing. It is not always possible to determine if the hyperglycemia will go away after the  steroids are stopped. A special blood test called an A1c is sometimes done to determine if your blood glucose was elevated before the steroids were started. SYMPTOMS  Thirsty.  Frequent urination.  Dry mouth.  Blurred vision.  Tired or fatigue.  Weakness.  Sleepy.  Tingling in feet or leg. DIAGNOSIS  Diagnosis is made by monitoring blood glucose in one or all of the following ways:  A1c test. This is a chemical found in your blood.  Fingerstick blood glucose monitoring.  Laboratory results. TREATMENT  First, knowing the cause of the hyperglycemia is important before the hyperglycemia can be treated. Treatment may include, but is not be limited to:  Education.  Change or adjustment in medications.  Change or adjustment in meal plan.  Treatment for an illness, infection, etc.  More frequent blood glucose monitoring.  Change in exercise plan.  Decreasing or stopping steroids.  Lifestyle changes. HOME CARE INSTRUCTIONS   Test your blood glucose as directed.  Exercise regularly. Your caregiver will give you instructions about exercise. Pre-diabetes or diabetes which comes on with stress is helped by exercising.  Eat wholesome, balanced meals. Eat often and at regular, fixed times. Your caregiver or nutritionist will give you a meal plan to guide your sugar  intake.  Being at an ideal weight is important. If needed, losing as little as 10 to 15 pounds may help improve blood glucose levels. SEEK MEDICAL CARE IF:   You have questions about medicine, activity, or diet.  You continue to have symptoms (problems such as increased thirst, urination, or weight gain). SEEK IMMEDIATE MEDICAL CARE IF:   You are vomiting or have diarrhea.  Your breath smells fruity.  You are breathing faster or slower.  You are very sleepy or incoherent.  You have numbness, tingling, or pain in your feet or hands.  You have chest pain.  Your symptoms get worse even though you have  been following your caregiver's orders.  If you have any other questions or concerns.   This information is not intended to replace advice given to you by your health care provider. Make sure you discuss any questions you have with your health care provider.   Document Released: 04/06/2001 Document Revised: 01/03/2012 Document Reviewed: 06/17/2015 Elsevier Interactive Patient Education [employer].

## 2016-05-01 NOTE — ED Notes (Signed)
Pt had episode of incontinence when checking CBG. Pt ambulated to bathroom, was very unsteady on feet.

## 2016-05-01 NOTE — ED Provider Notes (Addendum)
CSN: [id number]     Arrival date & time 04/30/16  2322 History   By signing my name below, I, Caroline Medina, attest that this documentation has been prepared under the direction and in the presence of Caroline Hacker, MD. Electronically Signed: [name], ED Scribe. 05/01/2016. 3:12 AM.   Chief Complaint  Patient presents with  . Shortness of Breath   The history is provided by the patient and a relative. No language interpreter was used.  HPI Comments (Level 5 Caveat due to Dementia): Caroline Medina is a 75 y.o. female with a hx of dementia, asthma, DM, CHF, COPD, and CAD who presents to the Emergency Department complaining of gradually worsening SOB onset one week ago. Pt brought in from home. Son states that pt has been complaining of left arm pain and SOB for one week. Patient is oriented 2. She reports that she has no complaints at this time. The patient son is not available for collateral information. Pt currently denies chest pain, fever, chills, nausea, or vomiting.   Past Medical History  Diagnosis Date  . Diabetes mellitus   . Hyperlipidemia   . Kidney stones   . Essential hypertension   . GERD (gastroesophageal reflux disease)   . Asthma 10/14/2013  . Adrenal adenoma (bilateral) 10/14/2013  . Obesity (BMI 30-39.9) 10/14/2013  . Depressive disorder, not elsewhere classified   . Epistaxis     allergic rhinnitis plus plavix  . Chronic diastolic CHF (congestive heart failure) ([place])   . COPD (chronic obstructive pulmonary disease) ([place])   . Coronary artery disease     a. with RCA DES 09/2007. b. NSTEMI 09/2013 s/p DES to Cx with residual moderate LAD/RCA stenosis, EF 55%.  . Memory loss    Past Surgical History  Procedure Laterality Date  . Coronary stent placement  09/2007    CAD with RCA DES, in december 2008  . Kidney stone surgery    . Abdominal hysterectomy    . Knee arthroscopy Right   . Carpal tunnel release    . Bunionectomy    . Cataract extraction     . Rotator cuff repair Left   . Bladder suspension    . Left heart catheterization with coronary angiogram N/A 10/15/2013    Procedure: LEFT HEART CATHETERIZATION WITH CORONARY ANGIOGRAM;  Surgeon: Sueanne Margarita, MD;  Location: [facility];  Service: Cardiovascular;  Laterality: N/A;  . Percutaneous coronary stent intervention (pci-s)  10/15/2013    Procedure: PERCUTANEOUS CORONARY STENT INTERVENTION (PCI-S);  Surgeon: Sueanne Margarita, MD;  Location: [facility] CATH LAB;  Service: Cardiovascular;;   Family History  Problem Relation Age of Onset  . Kidney disease Father   . Kidney failure Mother   . Diabetes Sister    Social History  Substance Use Topics  . Smoking status: Current Every Day Smoker -- 1.00 packs/day for 50 years    Types: Cigarettes  . Smokeless tobacco: Never Used  . Alcohol Use: 0.0 oz/week   OB History    No data available     Review of Systems  Unable to perform ROS: Dementia  Constitutional: Negative for fever.  Respiratory: Positive for shortness of breath. Negative for cough.   Musculoskeletal:       Left arm pain  All other systems reviewed and are negative.     Allergies  Lisinopril and Penicillins  Home Medications   Prior to Admission medications   Medication Sig Start Date End Date Taking? Authorizing Provider  amitriptyline (ELAVIL) 25 MG tablet Take 1 tablet (25 mg total) by mouth at bedtime. 10/17/13  Yes Belva Crome, MD  aspirin 81 MG tablet Take 81 mg by mouth daily.     Yes Historical Provider, MD  atorvastatin (LIPITOR) 20 MG tablet Take 20 mg by mouth daily at 6 PM.  03/10/16 03/10/17 Yes Historical Provider, MD  cloNIDine (CATAPRES) 0.1 MG tablet Take by mouth 2 (two) times daily.     Yes Historical Provider, MD  diltiazem (DILACOR XR) 240 MG 24 hr capsule  09/26/13  Yes Historical Provider, MD  donepezil (ARICEPT) 10 MG tablet Take 1 tablet (10 mg total) by mouth at bedtime. Start 5 mg daily x 1 month followed by 10 mg daily with food 04/29/16   Yes Garvin Fila, MD  folic acid (FOLVITE) 1 MG tablet Take 1 mg by mouth daily.   Yes Historical Provider, MD  gabapentin (NEURONTIN) 300 MG capsule Take 300 mg by mouth 2 (two) times daily.   Yes Historical Provider, MD  glipizide-metformin (METAGLIP) 2.5-250 MG per tablet Take 1 tablet by mouth 2 (two) times daily before a meal.    Yes Historical Provider, MD  losartan (COZAAR) 100 MG tablet Take 100 mg by mouth daily.  09/26/13  Yes Historical Provider, MD  Multiple Vitamins-Minerals (MULTIVITAMIN WITH MINERALS) tablet Take 1 tablet by mouth daily.   Yes Historical Provider, MD  omeprazole (PRILOSEC) 20 MG capsule Take 20 mg by mouth Daily.  04/11/12  Yes Historical Provider, MD  potassium chloride SA (K-DUR,KLOR-CON) 20 MEQ tablet Take 20 mEq by mouth daily.  12/03/15  Yes Historical Provider, MD  nitroGLYCERIN (NITROSTAT) 0.4 MG SL tablet Place 1 tablet (0.4 mg total) under the tongue every 5 (five) minutes as needed for chest pain. 10/17/13   Belva Crome, MD   BP 147/92 mmHg  Pulse 82  Temp(Src) 98.2 F (36.8 C) (Oral)  Resp 17  Wt 166 lb 12.8 oz (75.66 kg)  SpO2 98% Physical Exam  Constitutional: No distress.  HENT:  Head: Normocephalic and atraumatic.  Mouth/Throat: Oropharynx is clear and moist.  Cardiovascular: Normal rate, regular rhythm and normal heart sounds.   Pulmonary/Chest: Effort normal. No respiratory distress. She has wheezes.  Scant expiratory wheeze  Abdominal: Soft. Bowel sounds are normal. There is no tenderness. There is no rebound.  Musculoskeletal: She exhibits no edema.  Normal range of motion of the bilateral arms, no deformities noted, no tenderness to palpation  Neurological: She is alert.  Oriented to person and place not time  Skin: Skin is warm and dry.  Psychiatric: She has a normal mood and affect.  Nursing note and vitals reviewed.   ED Course  Procedures (including critical care time) DIAGNOSTIC STUDIES: Oxygen Saturation is 99% on RA,  normal by my interpretation.    COORDINATION OF CARE: 2:58 AM-labs, EKG, and x-ray    Labs Review Labs Reviewed  CBC WITH DIFFERENTIAL/PLATELET - Abnormal; Notable for the following:    RBC 5.18 (*)    Hemoglobin 15.5 (*)    HCT 46.1 (*)    All other components within normal limits  COMPREHENSIVE METABOLIC PANEL - Abnormal; Notable for the following:    Glucose, Bld 403 (*)    AST 12 (*)    GFR calc non Af Amer 55 (*)    All other components within normal limits  CBG MONITORING, ED - Abnormal; Notable for the following:    Glucose-Capillary 393 (*)    All  other components within normal limits  CBG MONITORING, ED - Abnormal; Notable for the following:    Glucose-Capillary 309 (*)    All other components within normal limits  I-STAT TROPOININ, ED    Imaging Review Dg Chest 2 View  05/01/2016  CLINICAL DATA:  Shortness of breath. EXAM: CHEST  2 VIEW COMPARISON:  Radiographs and CT 10/14/2013 FINDINGS: Mild decrease in cardiomegaly from prior, unchanged tortuosity of thoracic aorta. There is atherosclerosis of the aortic arch No pulmonary edema, focal airspace disease, pleural effusion or pneumothorax. IMPRESSION: No active cardiopulmonary disease. Cardiomegaly, with improvement from prior.  Aortic atherosclerosis. Electronically Signed   By: Jeb Levering M.D.   On: 05/01/2016 01:39   I have personally reviewed and evaluated these images and lab results as part of my medical decision-making.   EKG Interpretation   Date/Time:  Friday April 30 2016 23:33:08 EDT Ventricular Rate:  81 PR Interval:  144 QRS Duration: 72 QT Interval:  392 QTC Calculation: 455 R Axis:   26 Text Interpretation:  Normal sinus rhythm Biatrial enlargement Low voltage  QRS Cannot rule out Anterior infarct , age undetermined Abnormal ECG  Confirmed by Sania Noy  MD, Sal Spratley ([id number]) on 05/01/2016 2:34:36 AM      MDM   Final diagnoses:  SOB (shortness of breath)  Pain of left upper extremity   Hyperglycemia    Patient presents with reported concerns of shortness of breath and left arm pain. She currently denies any symptoms. Family is not available for collateral information. She is nontoxic-appearing. Afebrile. Satting well on room air. She has been given a DuoNeb for some scant wheezing. She satting well on room air. Presentation not consistent with COPD exacerbation. Chest x-ray is clear. She does not appear volume overloaded. She was noted to have a glucose of 43. She's not on insulin at home and it is unclear whether she is taking her glipizide as directed. Patient was given a fluid bolus. Steroids avoided given hyperglycemia. Repeat blood sugar 309.  After history, exam, and medical workup I feel the patient has been appropriately medically screened and is safe for discharge home. Pertinent diagnoses were discussed with the patient. Patient was given return precautions.  I personally performed the services described in this documentation, which was scribed in my presence. The recorded information has been reviewed and is accurate.    Caroline Hacker, MD 05/01/16 [phone]  Patient son is now on the room. He is concerned because his mother has complained of arm pain for 3 weeks. Patient had no complaints for me and her exam was reassuring on initial evaluation. I explained this to the son. He has not been using any pain medication at home. I would limit pain medication to Tylenol. No indication for imaging at this time. Son expresses that he is very overwhelmed as the primary caregiver with limited resources at home. I will have social work follow-up with the patient's son and have placed orders for face-to-face evaluation.  Caroline Hacker, MD 05/01/16 [phone]

## 2016-05-03 DIAGNOSIS — I2510 Atherosclerotic heart disease of native coronary artery without angina pectoris: Secondary | ICD-10-CM | POA: Diagnosis not present

## 2016-05-03 DIAGNOSIS — I110 Hypertensive heart disease with heart failure: Secondary | ICD-10-CM | POA: Diagnosis not present

## 2016-05-03 DIAGNOSIS — E785 Hyperlipidemia, unspecified: Secondary | ICD-10-CM | POA: Diagnosis not present

## 2016-05-03 DIAGNOSIS — J441 Chronic obstructive pulmonary disease with (acute) exacerbation: Secondary | ICD-10-CM | POA: Diagnosis not present

## 2016-05-03 DIAGNOSIS — K219 Gastro-esophageal reflux disease without esophagitis: Secondary | ICD-10-CM | POA: Diagnosis not present

## 2016-05-03 DIAGNOSIS — I5032 Chronic diastolic (congestive) heart failure: Secondary | ICD-10-CM | POA: Diagnosis not present

## 2016-05-03 DIAGNOSIS — E119 Type 2 diabetes mellitus without complications: Secondary | ICD-10-CM | POA: Diagnosis not present

## 2016-05-04 DIAGNOSIS — E782 Mixed hyperlipidemia: Secondary | ICD-10-CM | POA: Diagnosis not present

## 2016-05-04 DIAGNOSIS — I110 Hypertensive heart disease with heart failure: Secondary | ICD-10-CM | POA: Diagnosis not present

## 2016-05-04 DIAGNOSIS — K219 Gastro-esophageal reflux disease without esophagitis: Secondary | ICD-10-CM | POA: Diagnosis not present

## 2016-05-04 DIAGNOSIS — E119 Type 2 diabetes mellitus without complications: Secondary | ICD-10-CM | POA: Diagnosis not present

## 2016-05-04 DIAGNOSIS — I5032 Chronic diastolic (congestive) heart failure: Secondary | ICD-10-CM | POA: Diagnosis not present

## 2016-05-04 DIAGNOSIS — I252 Old myocardial infarction: Secondary | ICD-10-CM | POA: Diagnosis not present

## 2016-05-04 DIAGNOSIS — J45909 Unspecified asthma, uncomplicated: Secondary | ICD-10-CM | POA: Diagnosis not present

## 2016-05-04 DIAGNOSIS — I2510 Atherosclerotic heart disease of native coronary artery without angina pectoris: Secondary | ICD-10-CM | POA: Diagnosis not present

## 2016-05-04 DIAGNOSIS — E11621 Type 2 diabetes mellitus with foot ulcer: Secondary | ICD-10-CM | POA: Diagnosis not present

## 2016-05-04 DIAGNOSIS — E785 Hyperlipidemia, unspecified: Secondary | ICD-10-CM | POA: Diagnosis not present

## 2016-05-04 DIAGNOSIS — Z9181 History of falling: Secondary | ICD-10-CM | POA: Diagnosis not present

## 2016-05-04 DIAGNOSIS — L97421 Non-pressure chronic ulcer of left heel and midfoot limited to breakdown of skin: Secondary | ICD-10-CM | POA: Diagnosis not present

## 2016-05-04 DIAGNOSIS — J441 Chronic obstructive pulmonary disease with (acute) exacerbation: Secondary | ICD-10-CM | POA: Diagnosis not present

## 2016-05-04 DIAGNOSIS — E1140 Type 2 diabetes mellitus with diabetic neuropathy, unspecified: Secondary | ICD-10-CM | POA: Diagnosis not present

## 2016-05-06 DIAGNOSIS — E11621 Type 2 diabetes mellitus with foot ulcer: Secondary | ICD-10-CM | POA: Diagnosis not present

## 2016-05-06 DIAGNOSIS — L97421 Non-pressure chronic ulcer of left heel and midfoot limited to breakdown of skin: Secondary | ICD-10-CM | POA: Diagnosis not present

## 2016-05-06 DIAGNOSIS — I5032 Chronic diastolic (congestive) heart failure: Secondary | ICD-10-CM | POA: Diagnosis not present

## 2016-05-06 DIAGNOSIS — I110 Hypertensive heart disease with heart failure: Secondary | ICD-10-CM | POA: Diagnosis not present

## 2016-05-06 DIAGNOSIS — E119 Type 2 diabetes mellitus without complications: Secondary | ICD-10-CM | POA: Diagnosis not present

## 2016-05-06 DIAGNOSIS — E785 Hyperlipidemia, unspecified: Secondary | ICD-10-CM | POA: Diagnosis not present

## 2016-05-06 DIAGNOSIS — I252 Old myocardial infarction: Secondary | ICD-10-CM | POA: Diagnosis not present

## 2016-05-06 DIAGNOSIS — E1140 Type 2 diabetes mellitus with diabetic neuropathy, unspecified: Secondary | ICD-10-CM | POA: Diagnosis not present

## 2016-05-06 DIAGNOSIS — I2510 Atherosclerotic heart disease of native coronary artery without angina pectoris: Secondary | ICD-10-CM | POA: Diagnosis not present

## 2016-05-06 DIAGNOSIS — Z9181 History of falling: Secondary | ICD-10-CM | POA: Diagnosis not present

## 2016-05-06 DIAGNOSIS — E782 Mixed hyperlipidemia: Secondary | ICD-10-CM | POA: Diagnosis not present

## 2016-05-06 DIAGNOSIS — J45909 Unspecified asthma, uncomplicated: Secondary | ICD-10-CM | POA: Diagnosis not present

## 2016-05-06 DIAGNOSIS — J441 Chronic obstructive pulmonary disease with (acute) exacerbation: Secondary | ICD-10-CM | POA: Diagnosis not present

## 2016-05-06 DIAGNOSIS — K219 Gastro-esophageal reflux disease without esophagitis: Secondary | ICD-10-CM | POA: Diagnosis not present

## 2016-05-11 ENCOUNTER — Telehealth: Payer: Self-pay | Admitting: *Deleted

## 2016-05-11 NOTE — Telephone Encounter (Signed)
LVM requesting call back re: lab results. Left name, number. 

## 2016-05-12 ENCOUNTER — Encounter: Payer: Self-pay | Admitting: Physician Assistant

## 2016-05-13 DIAGNOSIS — J45909 Unspecified asthma, uncomplicated: Secondary | ICD-10-CM | POA: Diagnosis not present

## 2016-05-13 DIAGNOSIS — I110 Hypertensive heart disease with heart failure: Secondary | ICD-10-CM | POA: Diagnosis not present

## 2016-05-13 DIAGNOSIS — L97421 Non-pressure chronic ulcer of left heel and midfoot limited to breakdown of skin: Secondary | ICD-10-CM | POA: Diagnosis not present

## 2016-05-13 DIAGNOSIS — I252 Old myocardial infarction: Secondary | ICD-10-CM | POA: Diagnosis not present

## 2016-05-13 DIAGNOSIS — E11621 Type 2 diabetes mellitus with foot ulcer: Secondary | ICD-10-CM | POA: Diagnosis not present

## 2016-05-13 DIAGNOSIS — Z9181 History of falling: Secondary | ICD-10-CM | POA: Diagnosis not present

## 2016-05-13 DIAGNOSIS — E1140 Type 2 diabetes mellitus with diabetic neuropathy, unspecified: Secondary | ICD-10-CM | POA: Diagnosis not present

## 2016-05-13 DIAGNOSIS — E782 Mixed hyperlipidemia: Secondary | ICD-10-CM | POA: Diagnosis not present

## 2016-05-13 DIAGNOSIS — I2510 Atherosclerotic heart disease of native coronary artery without angina pectoris: Secondary | ICD-10-CM | POA: Diagnosis not present

## 2016-05-13 DIAGNOSIS — I5032 Chronic diastolic (congestive) heart failure: Secondary | ICD-10-CM | POA: Diagnosis not present

## 2016-05-19 NOTE — Telephone Encounter (Signed)
Spoke with son, Sabino Gasser and informed him, per Dr Leonie Man patient's lab work for reversible causes of memory loss was normal. Reminded him of her EEG on 06/01/16, 2:30 pm. Advised it is in this office. He verbalized understanding, appreciation.

## 2016-06-01 ENCOUNTER — Ambulatory Visit (INDEPENDENT_AMBULATORY_CARE_PROVIDER_SITE_OTHER): Payer: Medicare Other

## 2016-06-01 DIAGNOSIS — R410 Disorientation, unspecified: Secondary | ICD-10-CM | POA: Diagnosis not present

## 2016-06-01 DIAGNOSIS — F0281 Dementia in other diseases classified elsewhere with behavioral disturbance: Secondary | ICD-10-CM

## 2016-06-01 DIAGNOSIS — G301 Alzheimer's disease with late onset: Principal | ICD-10-CM

## 2016-06-01 DIAGNOSIS — F02818 Dementia in other diseases classified elsewhere, unspecified severity, with other behavioral disturbance: Secondary | ICD-10-CM

## 2016-07-07 ENCOUNTER — Encounter: Payer: Self-pay | Admitting: Neurology

## 2016-07-07 ENCOUNTER — Ambulatory Visit (INDEPENDENT_AMBULATORY_CARE_PROVIDER_SITE_OTHER): Payer: Medicare Other | Admitting: Neurology

## 2016-07-07 VITALS — BP 175/82 | HR 59 | Ht 67.0 in | Wt 164.0 lb

## 2016-07-07 DIAGNOSIS — G301 Alzheimer's disease with late onset: Secondary | ICD-10-CM

## 2016-07-07 DIAGNOSIS — F0280 Dementia in other diseases classified elsewhere without behavioral disturbance: Secondary | ICD-10-CM | POA: Diagnosis not present

## 2016-07-07 MED ORDER — MEMANTINE HCL 28 X 5 MG & 21 X 10 MG PO TABS: ORAL_TABLET | ORAL | 12 refills | Status: AC

## 2016-07-07 NOTE — Patient Instructions (Signed)
I had a long discussion with the patient and her son regarding her Alzheimer's dementia and discuss results of lab work and EEG. I recommend she keep an appointment for MRI on September 20. Since the patient did not have any response to Aricept and has been off it for nearly a week I recommend we switch to Namenda instead. I have discussed possible side effects with her son and advised him to call me if needed. The patient's son also requested letter stating patient's condition requires caregiver at home Patient was also instructed on fall and Safety precautions. Return for follow-up in 3 months with [facility] practitioner or call earlier if necessary.  Fall Prevention in the Home  Falls can cause injuries. They can happen to people of all ages. There are many things you can do to make your home safe and to help prevent falls.  WHAT CAN I DO ON THE OUTSIDE OF MY HOME?  Regularly fix the edges of walkways and driveways and fix any cracks.  Remove anything that might make you trip as you walk through a door, such as a raised step or threshold.  Trim any bushes or trees on the path to your home.  Use bright outdoor lighting.  Clear any walking paths of anything that might make someone trip, such as rocks or tools.  Regularly check to see if handrails are loose or broken. Make sure that both sides of any steps have handrails.  Any raised decks and porches should have guardrails on the edges.  Have any leaves, snow, or ice cleared regularly.  Use sand or salt on walking paths during winter.  Clean up any spills in your garage right away. This includes oil or grease spills. WHAT CAN I DO IN THE BATHROOM?   Use night lights.  Install grab bars by the toilet and in the tub and shower. Do not use towel bars as grab bars.  Use non-skid mats or decals in the tub or shower.  If you need to sit down in the shower, use a plastic, non-slip stool.  Keep the floor dry. Clean up any water that  spills on the floor as soon as it happens.  Remove soap buildup in the tub or shower regularly.  Attach bath mats securely with double-sided non-slip rug tape.  Do not have throw rugs and other things on the floor that can make you trip. WHAT CAN I DO IN THE BEDROOM?  Use night lights.  Make sure that you have a light by your bed that is easy to reach.  Do not use any sheets or blankets that are too big for your bed. They should not hang down onto the floor.  Have a firm chair that has side arms. You can use this for support while you get dressed.  Do not have throw rugs and other things on the floor that can make you trip. WHAT CAN I DO IN THE KITCHEN?  Clean up any spills right away.  Avoid walking on wet floors.  Keep items that you use a lot in easy-to-reach places.  If you need to reach something above you, use a strong step stool that has a grab bar.  Keep electrical cords out of the way.  Do not use floor polish or wax that makes floors slippery. If you must use wax, use non-skid floor wax.  Do not have throw rugs and other things on the floor that can make you trip. WHAT CAN I DO WITH  MY STAIRS?  Do not leave any items on the stairs.  Make sure that there are handrails on both sides of the stairs and use them. Fix handrails that are broken or loose. Make sure that handrails are as long as the stairways.  Check any carpeting to make sure that it is firmly attached to the stairs. Fix any carpet that is loose or worn.  Avoid having throw rugs at the top or bottom of the stairs. If you do have throw rugs, attach them to the floor with carpet tape.  Make sure that you have a light switch at the top of the stairs and the bottom of the stairs. If you do not have them, ask someone to add them for you. WHAT ELSE CAN I DO TO HELP PREVENT FALLS?  Wear shoes that:  Do not have high heels.  Have rubber bottoms.  Are comfortable and fit you well.  Are closed at the  toe. Do not wear sandals.  If you use a stepladder:  Make sure that it is fully opened. Do not climb a closed stepladder.  Make sure that both sides of the stepladder are locked into place.  Ask someone to hold it for you, if possible.  Clearly mark and make sure that you can see:  Any grab bars or handrails.  First and last steps.  Where the edge of each step is.  Use tools that help you move around (mobility aids) if they are needed. These include:  Canes.  Walkers.  Scooters.  Crutches.  Turn on the lights when you go into a dark area. Replace any light bulbs as soon as they burn out.  Set up your furniture so you have a clear path. Avoid moving your furniture around.  If any of your floors are uneven, fix them.  If there are any pets around you, be aware of where they are.  Review your medicines with your doctor. Some medicines can make you feel dizzy. This can increase your chance of falling. Ask your doctor what other things that you can do to help prevent falls.   This information is not intended to replace advice given to you by your health care provider. Make sure you discuss any questions you have with your health care provider.   Document Released: 08/07/2009 Document Revised: 02/25/2015 Document Reviewed: 11/15/2014 Elsevier Interactive Patient Education [employer].

## 2016-07-07 NOTE — Progress Notes (Signed)
[facility] [address] [place]. [place] [address] [phone]       OFFICE CONSULT NOTE  Ms. LOUVENA FESS Date of Birth:  04/26/1941 Medical Record Number:  [id number]   Referring MD:  Orie Fisherman, PA-c  Reason for Referral:  Dementia  [id number] consult  04/29/2016 : Ms Gradowski is a 41 year [place] lady who is accompanied today by her son who provides most of the history. Referral notes from primary care physician also reviewed. Patient has had a progressive cognitive decline for at least a year or so. This has been progressive. He first noticed this when he was on a trip with her to [place] where it was very difficult for her to adapt to changes and learned new information. Patient often repeats herself and has to be constantly told the same information over and over but does not remember. She at times has been found wandering out of her house onto a busy highway. She is not able to handle the finances and leaves money and stuff in her house spread all over. She often misplaces objects and cannot find them. She believes the television lights on and [place] running. Patient has also had behavioral agitation and she is stubborn to any changes. Son has to help her out more and more. She still lives by herself. She has not had any recent brain imaging studies done. Or tried any dementia medications. Review of referral notes reveal comprehensive metabolic panel done on [id number] being unremarkable except elevated glucose of 292 mg percent. Total cholesterol was 214, LDL 143. Albumin A1c was 11.9. Patient has not had any episodes of loss of consciousness, seizures, strokes, TIA. She does have multiple medical problems including diabetes hyper lipidemia, hypertension, congestive heart failure, recurrent falls. There is no family history of Alzheimer's dementia. Update 07/08/2016 ; She returns for follow-up after last visit 2 months ago. She is accompanied today by her    son. Patient was able to tolerate Aricept but the son did not notice significant cognitive benefit. She in fact ran out of Aricept 5 days ago and son has not noticed any significant changes either. The patient had lab work done for reversible causes of memory loss and last visit and vitamin B12, TSH, RPR and homocysteine were all normal. She also had an EEG done which showed mild generalized slowing without any epileptiform activity. MRI is scheduled to be done on September 20 as the patient's son was involved in a motor vehicle accident and was not able to take the mother MRI appointment so far. The patient continues to have significant short-term memory and cognitive difficulties. She needs close supervision at home. She is able to walk independently but has poor balance and has had several minor falls but fortunately no injuries. She's not had any significant delusions, and vaccinations for behavioral agitation. She's not had any unsafe behaviors. ROS:   14 system review of systems is positive for cough, wheezing, shortness of breath, chest tightness, chest pain, frequent waking, walking difficulty, headache, agitation, behavioral problem memory loss, confusion and all other systems negative.  PMH:  Past Medical History:  Diagnosis Date  . Adrenal adenoma (bilateral) 10/14/2013  . Asthma 10/14/2013  . Chronic diastolic CHF (congestive heart failure) ([place])   . COPD (chronic obstructive pulmonary disease) ([place])   . Coronary artery disease    a. with RCA DES 09/2007. b. NSTEMI 09/2013 s/p DES to Cx with residual moderate LAD/RCA stenosis, EF 55%.  . Depressive disorder,  not elsewhere classified   . Diabetes mellitus   . Epistaxis    allergic rhinnitis plus plavix  . Essential hypertension   . GERD (gastroesophageal reflux disease)   . Hyperlipidemia   . Kidney stones   . Memory loss   . Obesity (BMI 30-39.9) 10/14/2013    Social History:  Social History   Social History  . Marital  status: Widowed    Spouse name: N/A  . Number of children: N/A  . Years of education: N/A   Occupational History  . Not on file.   Social History Main Topics  . Smoking status: Current Every Day Smoker    Packs/day: 1.00    Years: 50.00    Types: Cigarettes  . Smokeless tobacco: Never Used  . Alcohol use 0.0 oz/week  . Drug use: No  . Sexual activity: No   Other Topics Concern  . Not on file   Social History Narrative   Widow          Medications:   Current Outpatient Prescriptions on File Prior to Visit  Medication Sig Dispense Refill  . acetaminophen (TYLENOL) 500 MG tablet Take 1 tablet (500 mg total) by mouth every 6 (six) hours as needed. 30 tablet 0  . amitriptyline (ELAVIL) 25 MG tablet Take 1 tablet (25 mg total) by mouth at bedtime.    Marland Kitchen aspirin 81 MG tablet Take 81 mg by mouth daily.      Marland Kitchen atorvastatin (LIPITOR) 20 MG tablet Take 20 mg by mouth daily at 6 PM.     . cloNIDine (CATAPRES) 0.1 MG tablet Take by mouth 2 (two) times daily.      Marland Kitchen diltiazem (DILACOR XR) 240 MG 24 hr capsule     . folic acid (FOLVITE) 1 MG tablet Take 1 mg by mouth daily.    Marland Kitchen gabapentin (NEURONTIN) 300 MG capsule Take 300 mg by mouth 2 (two) times daily.    Marland Kitchen glipizide-metformin (METAGLIP) 2.5-250 MG per tablet Take 1 tablet by mouth 2 (two) times daily before a meal.     . losartan (COZAAR) 100 MG tablet Take 100 mg by mouth daily.     . Multiple Vitamins-Minerals (MULTIVITAMIN WITH MINERALS) tablet Take 1 tablet by mouth daily.    . nitroGLYCERIN (NITROSTAT) 0.4 MG SL tablet Place 1 tablet (0.4 mg total) under the tongue every 5 (five) minutes as needed for chest pain. 25 tablet 12  . omeprazole (PRILOSEC) 20 MG capsule Take 20 mg by mouth Daily.     . potassium chloride SA (K-DUR,KLOR-CON) 20 MEQ tablet Take 20 mEq by mouth daily.      No current facility-administered medications on file prior to visit.     Allergies:   Allergies  Allergen Reactions  . Lisinopril Other (See  Comments)    cough  . Penicillins Rash    Has patient had a PCN reaction causing immediate rash, facial/tongue/throat swelling, SOB or lightheadedness with hypotension: Yes Has patient had a PCN reaction causing severe rash involving mucus membranes or skin necrosis: No Has patient had a PCN reaction that required hospitalization No Has patient had a PCN reaction occurring within the last 10 years: Yes If all of the above answers are "NO", then may proceed with Cephalosporin use.     Physical Exam General: Frail elderly African-American lady, seated, in no evident distress Head: head normocephalic and atraumatic.   Neck: supple with no carotid or supraclavicular bruits Cardiovascular: regular rate and rhythm, no murmurs Musculoskeletal:  no deformity Skin:  no rash/petichiae Vascular:  Normal pulses all extremities  Neurologic Exam Mental Status: Awake and fully alert. Oriented to place and time. Recent and remote memory Poor. Attention span, concentration and fund of knowledge diminished. Mood and affect appropriate. Mini-Mental status exam scored 18/30 with deficits in orientation, attention, calculation and recall. Unable to clock drawing 1/4.Able to name only 3 animals Cranial Nerves: Fundoscopic exam reveals sharp disc margins. Pupils equal, briskly reactive to light. Extraocular movements full without nystagmus. Visual fields full to confrontation. Hearing intact. Facial sensation intact. Face, tongue, palate moves normally and symmetrically.  Motor: Normal bulk and tone. Normal strength in all tested extremity muscles. Sensory.: intact to touch , pinprick , position and vibratory sensation.  Coordination: Rapid alternating movements normal in all extremities. Finger-to-nose and heel-to-shin performed accurately bilaterally. Gait and Station: Arises from chair without difficulty. Stance is slightly stooped Gait demonstrates normal stride length and mild imbalance . Able to heel, toe  and tandem walk without difficulty.  Reflexes: 1+ and symmetric. Toes downgoing.       ASSESSMENT: 26 year lady with one-year progressive cognitive decline likely due to mild Alzheimer's dementia.    PLAN: I had a long discussion with the patient and her son regarding her Alzheimer's dementia and discuss results of lab work and EEG. I recommend she keep an appointment for MRI on September 20. Since the patient did not have any response to Aricept and has been off it for nearly a week I recommend we switch to Namenda instead. I have discussed possible side effects with her son and advised him to call me if needed. The patient's son also requested letter stating patient's condition requires caregiver at home Patient was also instructed on fall and Safety precautions. Return for follow-up in 3 months with [place] practitioner or call earlier if necessary. Antony Contras, MD  [facility] [address] [place], [place] [address]  Phone [phone] Fax [phone]: This document was prepared with digital dictation and possible smart phrase technology. Any transcriptional errors that result from this process are unintentional.

## 2016-07-12 DIAGNOSIS — I2510 Atherosclerotic heart disease of native coronary artery without angina pectoris: Secondary | ICD-10-CM | POA: Diagnosis not present

## 2016-07-12 DIAGNOSIS — G301 Alzheimer's disease with late onset: Secondary | ICD-10-CM | POA: Diagnosis not present

## 2016-07-12 DIAGNOSIS — Z0000 Encounter for general adult medical examination without abnormal findings: Secondary | ICD-10-CM | POA: Diagnosis not present

## 2016-07-14 ENCOUNTER — Other Ambulatory Visit: Payer: Self-pay

## 2016-07-20 DIAGNOSIS — I5032 Chronic diastolic (congestive) heart failure: Secondary | ICD-10-CM | POA: Diagnosis not present

## 2016-07-20 DIAGNOSIS — I110 Hypertensive heart disease with heart failure: Secondary | ICD-10-CM | POA: Diagnosis not present

## 2016-07-20 DIAGNOSIS — Z7982 Long term (current) use of aspirin: Secondary | ICD-10-CM | POA: Diagnosis not present

## 2016-07-20 DIAGNOSIS — K219 Gastro-esophageal reflux disease without esophagitis: Secondary | ICD-10-CM | POA: Diagnosis not present

## 2016-07-20 DIAGNOSIS — J45909 Unspecified asthma, uncomplicated: Secondary | ICD-10-CM | POA: Diagnosis not present

## 2016-07-20 DIAGNOSIS — E782 Mixed hyperlipidemia: Secondary | ICD-10-CM | POA: Diagnosis not present

## 2016-07-20 DIAGNOSIS — I252 Old myocardial infarction: Secondary | ICD-10-CM | POA: Diagnosis not present

## 2016-07-20 DIAGNOSIS — I2510 Atherosclerotic heart disease of native coronary artery without angina pectoris: Secondary | ICD-10-CM | POA: Diagnosis not present

## 2016-07-20 DIAGNOSIS — G301 Alzheimer's disease with late onset: Secondary | ICD-10-CM | POA: Diagnosis not present

## 2016-07-20 DIAGNOSIS — E1140 Type 2 diabetes mellitus with diabetic neuropathy, unspecified: Secondary | ICD-10-CM | POA: Diagnosis not present

## 2016-07-20 DIAGNOSIS — Z9181 History of falling: Secondary | ICD-10-CM | POA: Diagnosis not present

## 2016-07-21 ENCOUNTER — Inpatient Hospital Stay: Admission: RE | Admit: 2016-07-21 | Payer: Self-pay | Source: Ambulatory Visit

## 2016-07-27 ENCOUNTER — Emergency Department (HOSPITAL_COMMUNITY): Payer: Medicare Other

## 2016-07-27 ENCOUNTER — Emergency Department (HOSPITAL_COMMUNITY)
Admission: EM | Admit: 2016-07-27 | Discharge: 2016-07-28 | Disposition: A | Discharge status: Home / Self Care | Payer: Medicare Other | Attending: Emergency Medicine | Admitting: Emergency Medicine

## 2016-07-27 ENCOUNTER — Encounter (HOSPITAL_COMMUNITY): Payer: Self-pay | Admitting: Emergency Medicine

## 2016-07-27 DIAGNOSIS — Z9181 History of falling: Secondary | ICD-10-CM | POA: Diagnosis not present

## 2016-07-27 DIAGNOSIS — R9089 Other abnormal findings on diagnostic imaging of central nervous system: Secondary | ICD-10-CM | POA: Diagnosis not present

## 2016-07-27 DIAGNOSIS — Z79899 Other long term (current) drug therapy: Secondary | ICD-10-CM | POA: Insufficient documentation

## 2016-07-27 DIAGNOSIS — F0280 Dementia in other diseases classified elsewhere without behavioral disturbance: Secondary | ICD-10-CM

## 2016-07-27 DIAGNOSIS — J45909 Unspecified asthma, uncomplicated: Secondary | ICD-10-CM | POA: Diagnosis not present

## 2016-07-27 DIAGNOSIS — E1165 Type 2 diabetes mellitus with hyperglycemia: Secondary | ICD-10-CM | POA: Insufficient documentation

## 2016-07-27 DIAGNOSIS — I5032 Chronic diastolic (congestive) heart failure: Secondary | ICD-10-CM | POA: Insufficient documentation

## 2016-07-27 DIAGNOSIS — F17210 Nicotine dependence, cigarettes, uncomplicated: Secondary | ICD-10-CM | POA: Insufficient documentation

## 2016-07-27 DIAGNOSIS — J449 Chronic obstructive pulmonary disease, unspecified: Secondary | ICD-10-CM | POA: Diagnosis not present

## 2016-07-27 DIAGNOSIS — I110 Hypertensive heart disease with heart failure: Secondary | ICD-10-CM | POA: Insufficient documentation

## 2016-07-27 DIAGNOSIS — Z791 Long term (current) use of non-steroidal anti-inflammatories (NSAID): Secondary | ICD-10-CM | POA: Insufficient documentation

## 2016-07-27 DIAGNOSIS — G309 Alzheimer's disease, unspecified: Secondary | ICD-10-CM | POA: Diagnosis not present

## 2016-07-27 DIAGNOSIS — Z7982 Long term (current) use of aspirin: Secondary | ICD-10-CM | POA: Diagnosis not present

## 2016-07-27 DIAGNOSIS — E782 Mixed hyperlipidemia: Secondary | ICD-10-CM | POA: Diagnosis not present

## 2016-07-27 DIAGNOSIS — R739 Hyperglycemia, unspecified: Secondary | ICD-10-CM

## 2016-07-27 DIAGNOSIS — S069X9A Unspecified intracranial injury with loss of consciousness of unspecified duration, initial encounter: Secondary | ICD-10-CM | POA: Diagnosis not present

## 2016-07-27 DIAGNOSIS — R296 Repeated falls: Secondary | ICD-10-CM | POA: Diagnosis present

## 2016-07-27 DIAGNOSIS — M25552 Pain in left hip: Secondary | ICD-10-CM | POA: Diagnosis not present

## 2016-07-27 DIAGNOSIS — E1140 Type 2 diabetes mellitus with diabetic neuropathy, unspecified: Secondary | ICD-10-CM | POA: Diagnosis not present

## 2016-07-27 DIAGNOSIS — G301 Alzheimer's disease with late onset: Secondary | ICD-10-CM | POA: Insufficient documentation

## 2016-07-27 DIAGNOSIS — I2510 Atherosclerotic heart disease of native coronary artery without angina pectoris: Secondary | ICD-10-CM | POA: Insufficient documentation

## 2016-07-27 DIAGNOSIS — I252 Old myocardial infarction: Secondary | ICD-10-CM | POA: Diagnosis not present

## 2016-07-27 DIAGNOSIS — S299XXA Unspecified injury of thorax, initial encounter: Secondary | ICD-10-CM | POA: Diagnosis not present

## 2016-07-27 DIAGNOSIS — E1065 Type 1 diabetes mellitus with hyperglycemia: Secondary | ICD-10-CM | POA: Diagnosis not present

## 2016-07-27 DIAGNOSIS — K219 Gastro-esophageal reflux disease without esophagitis: Secondary | ICD-10-CM | POA: Diagnosis not present

## 2016-07-27 DIAGNOSIS — Z7984 Long term (current) use of oral hypoglycemic drugs: Secondary | ICD-10-CM | POA: Diagnosis not present

## 2016-07-27 LAB — CBG MONITORING, ED: Glucose-Capillary: 264 mg/dL — ABNORMAL HIGH (ref 65–99)

## 2016-07-27 LAB — COMPREHENSIVE METABOLIC PANEL
ALT: 17 U/L (ref 14–54)
ANION GAP: 7 (ref 5–15)
AST: 17 U/L (ref 15–41)
Albumin: 4 g/dL (ref 3.5–5.0)
Alkaline Phosphatase: 87 U/L (ref 38–126)
BILIRUBIN TOTAL: 0.6 mg/dL (ref 0.3–1.2)
BUN: 12 mg/dL (ref 6–20)
CO2: 28 mmol/L (ref 22–32)
Calcium: 9.7 mg/dL (ref 8.9–10.3)
Chloride: 101 mmol/L (ref 101–111)
Creatinine, Ser: 0.77 mg/dL (ref 0.44–1.00)
GFR calc Af Amer: >60 mL/min (ref >60)
Glucose, Bld: 426 mg/dL — ABNORMAL HIGH (ref 65–99)
POTASSIUM: 3.7 mmol/L (ref 3.5–5.1)
Sodium: 136 mmol/L (ref 135–145)
TOTAL PROTEIN: 8.3 g/dL — AB (ref 6.5–8.1)

## 2016-07-27 LAB — CBC
HEMATOCRIT: 43.7 % (ref 36.0–46.0)
Hemoglobin: 14.6 g/dL (ref 12.0–15.0)
MCH: 29.6 pg (ref 26.0–34.0)
MCHC: 33.4 g/dL (ref 30.0–36.0)
MCV: 88.5 fL (ref 78.0–100.0)
Platelets: 282 10*3/uL (ref 150–400)
RBC: 4.94 MIL/uL (ref 3.87–5.11)
RDW: 13.6 % (ref 11.5–15.5)
WBC: 8.9 10*3/uL (ref 4.0–10.5)

## 2016-07-27 LAB — URINALYSIS, ROUTINE W REFLEX MICROSCOPIC
Bilirubin Urine: NEGATIVE
Glucose, UA: >1000 mg/dL — AB
KETONES UR: NEGATIVE mg/dL
LEUKOCYTES UA: NEGATIVE
NITRITE: NEGATIVE
PH: 5.5 (ref 5.0–8.0)
PROTEIN: NEGATIVE mg/dL
Specific Gravity, Urine: 1.039 — ABNORMAL HIGH (ref 1.005–1.030)

## 2016-07-27 LAB — URINE MICROSCOPIC-ADD ON

## 2016-07-27 LAB — LIPASE, BLOOD: Lipase: 42 U/L (ref 11–51)

## 2016-07-27 MED ORDER — SODIUM CHLORIDE 0.9 % IV BOLUS (SEPSIS)
1000.0000 mL | Freq: Once | INTRAVENOUS | Status: AC
  Administered 2016-07-27: 1000 mL via INTRAVENOUS

## 2016-07-27 NOTE — ED Notes (Addendum)
NP at bedside.

## 2016-07-27 NOTE — ED Triage Notes (Signed)
Pt's son's number is [phone]. Friends numbers: [phone].

## 2016-07-27 NOTE — ED Triage Notes (Addendum)
Pt fell 3 times in past 3 days, injured left leg and hip yesterday, increasing loss of balance. No head injury, no LOC. Pt c/o RLQ abdominal pain.   Pt has dementia, family is unable to keep patient safe from falls, or keep track of patient's medications, requesting placement, in contact with a case manager already. Physical therapist instructed pt to go to ED for hypertension.

## 2016-07-27 NOTE — ED Notes (Signed)
[address] [place] [phone]

## 2016-07-27 NOTE — ED Provider Notes (Signed)
[facility] DEPT Provider Note   CSN: [id number] Arrival date & time: 07/27/16  1641  By signing my name below, I, Gwenlyn Fudge, attest that this documentation has been prepared under the direction and in the presence of Junius Creamer, NP. Electronically Signed: Gwenlyn Fudge, ED Scribe. 07/27/16. 8:42 PM.  History   Chief Complaint Chief Complaint  Patient presents with  . Fall  . Abdominal Pain  . Hip Pain   The history is provided by the patient and a relative (Son). No language interpreter was used.   HPI Comments: Caroline Medina is a 75 y.o. female with PMHx of COPD, CHF, DM and Alzheimer's Disease who presents to the Emergency Department complaining of frequent falls over the past few days. Pt has had 3 falls in the past 3 days. Pt denies dizziness prior to falls and LOC. She states she does not favor any specific direction when she falls, but is prone to falling forward and backward. Son is not able to keep track of pt's medications or keep her safe from falls while he is away at work. Pt saw her PCP yesterday. Son is working to request a home health aid.  Past Medical History:  Diagnosis Date  . Adrenal adenoma (bilateral) 10/14/2013  . Asthma 10/14/2013  . Chronic diastolic CHF (congestive heart failure) ([place])   . COPD (chronic obstructive pulmonary disease) ([place])   . Coronary artery disease    a. with RCA DES 09/2007. b. NSTEMI 09/2013 s/p DES to Cx with residual moderate LAD/RCA stenosis, EF 55%.  . Depressive disorder, not elsewhere classified   . Diabetes mellitus   . Epistaxis    allergic rhinnitis plus plavix  . Essential hypertension   . GERD (gastroesophageal reflux disease)   . Hyperlipidemia   . Kidney stones   . Memory loss   . Obesity (BMI 30-39.9) 10/14/2013    Patient Active Problem List   Diagnosis Date Noted  . Alzheimer's disease 04/29/2016  . Memory loss 04/29/2016  . Chronic diastolic CHF (congestive heart failure) ([place])   . Essential  hypertension   . NSTEMI (non-ST elevated myocardial infarction) ([place]) 10/15/2013  . CAD (coronary artery disease) 10/14/2013  . Hyperlipidemia 10/14/2013  . Poorly controlled type 2 diabetes mellitus with neuropathy ([place]) 10/14/2013  . Hypertensive heart disease 10/14/2013  . Obesity (BMI 30-39.9) 10/14/2013  . Tobacco abuse 10/14/2013  . Chest pain at rest 10/14/2013  . Adrenal adenoma (bilateral) 10/14/2013  . Asthma 10/14/2013  . GERD (gastroesophageal reflux disease)     Past Surgical History:  Procedure Laterality Date  . ABDOMINAL HYSTERECTOMY    . BLADDER SUSPENSION    . BUNIONECTOMY    . CARPAL TUNNEL RELEASE    . CATARACT EXTRACTION    . CORONARY STENT PLACEMENT  09/2007   CAD with RCA DES, in december 2008  . KIDNEY STONE SURGERY    . KNEE ARTHROSCOPY Right   . LEFT HEART CATHETERIZATION WITH CORONARY ANGIOGRAM N/A 10/15/2013   Procedure: LEFT HEART CATHETERIZATION WITH CORONARY ANGIOGRAM;  Surgeon: Sueanne Margarita, MD;  Location: [facility] CATH LAB;  Service: Cardiovascular;  Laterality: N/A;  . PERCUTANEOUS CORONARY STENT INTERVENTION (PCI-S)  10/15/2013   Procedure: PERCUTANEOUS CORONARY STENT INTERVENTION (PCI-S);  Surgeon: Sueanne Margarita, MD;  Location: [facility] CATH LAB;  Service: Cardiovascular;;  . ROTATOR CUFF REPAIR Left     OB History    No data available       Home Medications    Prior to Admission medications  Medication Sig Start Date End Date Taking? Authorizing Provider  acetaminophen (TYLENOL) 500 MG tablet Take 1 tablet (500 mg total) by mouth every 6 (six) hours as needed. 05/01/16   [name], MD  amitriptyline (ELAVIL) 25 MG tablet Take 1 tablet (25 mg total) by mouth at bedtime. 10/17/13   Belva Crome, MD  aspirin 81 MG tablet Take 81 mg by mouth daily.      Historical Provider, MD  atorvastatin (LIPITOR) 20 MG tablet Take 20 mg by mouth daily at 6 PM.  03/10/16 03/10/17  Historical Provider, MD  cloNIDine (CATAPRES) 0.1 MG tablet Take by mouth 2  (two) times daily.      Historical Provider, MD  diltiazem (DILACOR XR) 240 MG 24 hr capsule  09/26/13   Historical Provider, MD  folic acid (FOLVITE) 1 MG tablet Take 1 mg by mouth daily.    Historical Provider, MD  gabapentin (NEURONTIN) 300 MG capsule Take 300 mg by mouth 2 (two) times daily.    Historical Provider, MD  glipizide-metformin (METAGLIP) 2.5-250 MG per tablet Take 1 tablet by mouth 2 (two) times daily before a meal.     Historical Provider, MD  losartan (COZAAR) 100 MG tablet Take 100 mg by mouth daily.  09/26/13   Historical Provider, MD  memantine (NAMENDA TITRATION PAK) tablet pack 5 mg/day for =1 week; 5 mg twice daily for =1 week; 15 mg/day given in 5 mg and 10 mg separated doses for =1 week; then 10 mg twice daily 07/07/16   Garvin Fila, MD  Multiple Vitamins-Minerals (MULTIVITAMIN WITH MINERALS) tablet Take 1 tablet by mouth daily.    Historical Provider, MD  nitroGLYCERIN (NITROSTAT) 0.4 MG SL tablet Place 1 tablet (0.4 mg total) under the tongue every 5 (five) minutes as needed for chest pain. 10/17/13   Belva Crome, MD  omeprazole (PRILOSEC) 20 MG capsule Take 20 mg by mouth Daily.  04/11/12   Historical Provider, MD  potassium chloride SA (K-DUR,KLOR-CON) 20 MEQ tablet Take 20 mEq by mouth daily.  12/03/15   Historical Provider, MD    Family History Family History  Problem Relation Age of Onset  . Kidney disease Father   . Kidney failure Mother   . Diabetes Sister     Social History Social History  Substance Use Topics  . Smoking status: Current Every Day Smoker    Packs/day: 1.00    Years: 50.00    Types: Cigarettes  . Smokeless tobacco: Never Used  . Alcohol use 0.0 oz/week     Allergies   Lisinopril and Penicillins   Review of Systems Review of Systems  Constitutional: Negative for activity change, appetite change and fever.  Respiratory: Negative for cough.   Cardiovascular: Negative for chest pain.  Gastrointestinal: Negative for abdominal pain  and vomiting.  Genitourinary: Negative for dysuria.  Musculoskeletal: Positive for arthralgias.  Neurological: Negative for dizziness, syncope and weakness.  Psychiatric/Behavioral: Positive for confusion.  All other systems reviewed and are negative.  Physical Exam Updated Vital Signs BP 154/88   Pulse 72   Temp 98.1 F (36.7 C) (Oral)   Resp 18   SpO2 100%   Physical Exam  Constitutional: She appears well-developed and well-nourished. She is active. No distress.  HENT:  Head: Normocephalic.  Left Ear: External ear normal.  Mouth/Throat: Oropharynx is clear and moist.  Eyes: Conjunctivae are normal.  Cardiovascular: Normal rate.   Pulmonary/Chest: Effort normal. No respiratory distress.  Musculoskeletal: Normal range of motion.  Neurological: She is alert.  Skin: Skin is warm and dry.  Psychiatric: She has a normal mood and affect. Her behavior is normal.  Nursing note and vitals reviewed.  ED Treatments / Results  DIAGNOSTIC STUDIES: Oxygen Saturation is 97% on RA, adequate by my interpretation.    COORDINATION OF CARE: 8:37 PM Discussed treatment plan with pt at bedside which includes consultation with Case Manager or Social worker and pt agreed to plan.  Labs (all labs ordered are listed, but only abnormal results are displayed) Labs Reviewed  COMPREHENSIVE METABOLIC PANEL - Abnormal; Notable for the following:       Result Value   Glucose, Bld 426 (*)    Total Protein 8.3 (*)    All other components within normal limits  URINALYSIS, ROUTINE W REFLEX MICROSCOPIC (NOT AT [facility]) - Abnormal; Notable for the following:    Specific Gravity, Urine 1.039 (*)    Glucose, UA >1000 (*)    Hgb urine dipstick MODERATE (*)    All other components within normal limits  URINE MICROSCOPIC-ADD ON - Abnormal; Notable for the following:    Squamous Epithelial / LPF 0-5 (*)    Bacteria, UA RARE (*)    All other components within normal limits  CBG MONITORING, ED - Abnormal;  Notable for the following:    Glucose-Capillary 264 (*)    All other components within normal limits  LIPASE, BLOOD  CBC    EKG  EKG Interpretation None       Radiology Ct Head Wo Contrast  Result Date: 07/27/2016 CLINICAL DATA:  Patient fell 3 times in the past 3 days. Loss of balance. Dementia. EXAM: CT HEAD WITHOUT CONTRAST TECHNIQUE: Contiguous axial images were obtained from the base of the skull through the vertex without intravenous contrast. COMPARISON:  None. FINDINGS: Brain: Mild diffuse cerebral atrophy. Ventricular dilatation consistent with central atrophy. Low-attenuation changes in the deep white matter consistent small vessel ischemia. No mass effect or midline shift. No abnormal extra-axial fluid collections. Gray-white matter junctions are distinct. Basal cisterns are not effaced. No evidence of acute intracranial hemorrhage. Vascular: Atherosclerotic vascular calcifications are present. Skull: Normal. Negative for fracture or focal lesion. Sinuses/Orbits: Mild mucosal thickening in the ethmoid air cells. No acute air-fluid levels. Mastoid air cells are not opacified. Other: None. IMPRESSION: No acute intracranial abnormalities. Chronic atrophy and small vessel ischemic changes. Electronically Signed   By: Lucienne Capers M.D.   On: 07/27/2016 21:32   Dg Hip Unilat With Pelvis 2-3 Views Left  Result Date: 07/27/2016 CLINICAL DATA:  [place] with bilateral hip pain, left side greater than right. EXAM: DG HIP (WITH OR WITHOUT PELVIS) 2-3V LEFT COMPARISON:  Pelvic CT 09/20/2010 FINDINGS: Pelvic bony ring is intact. Scattered phleboliths in the pelvis. No gross abnormality to the right hip. Left hip is located without a fracture. Nonobstructive bowel gas pattern. IMPRESSION: No acute abnormality. Electronically Signed   By: Markus Daft M.D.   On: 07/27/2016 17:43    Procedures Procedures (including critical care time)  Medications Ordered in ED Medications  sodium chloride 0.9  % bolus 1,000 mL (1,000 mLs Intravenous New Bag/Given 07/27/16 2201)     Initial Impression / Assessment and Plan / ED Course  I have reviewed the triage vital signs and the nursing notes.  Pertinent labs & imaging results that were available during my care of the patient were reviewed by me and considered in my medical decision making (see chart for details). Other then elevated BS labs  normal  Will have case manager speak with son re: home care safety etc I did not give home medication as I patine tis unsure if she took them and did not want a hypoglycemic episode.  Son was give outpatient resources, a home health assessment was requested  Clinical Course   I personally performed the services described in this documentation, which was scribed in my presence. The recorded information has been reviewed and is accurate.  Final Clinical Impressions(s) / ED Diagnoses   Final diagnoses:  Hyperglycemia  Late onset Alzheimer's disease without behavioral disturbance    New Prescriptions New Prescriptions   No medications on file     Junius Creamer, NP 07/27/16 2111    Junius Creamer, NP 07/27/16 2324    Lacretia Leigh, MD 07/30/16 0001

## 2016-07-27 NOTE — ED Notes (Signed)
This nurse asked pt's son to stay at [facility] ER because pt would be discharged in the next 30 minutes; son stated that he needed to leave  Requested new phone number from son because previous nurse tried to contact him and was unsuccessful; new number was documented but this number is not functional either

## 2016-07-27 NOTE — Progress Notes (Signed)
CSW provided patient with list of assisted living/ memory care facilities in the area.   Kingsley Spittle, [place] Clinical Social Worker [phone]

## 2016-07-27 NOTE — Care Management Note (Addendum)
Case Management Note  Patient Details  Name: Caroline Medina MRN: [id number] Date of Birth: 08/03/1941  Subjective/Objective:   Patient presents to ED having multiple fall at home                 Action/Plan: Discussed home health services with patient and her son Caroline Medina at bedside.  Patient's son reports patient has been slowly declinig mentally for six months.  The patient is having more falls at home.  Patient's son reports he would like patient to have home health services with [facility] as patient has had this agency in the past.  He is ultimately wanting patient to be place in a memory care facility.  He reports he can have someone come in a check in on the patient during the day, but reports he feels the patient requires 24 hour care.  Patient's son reports he and his girlfriend Caroline Medina assist patient with her ADL's.  Patient's son reports he works during the day and worries about his mother at home.  EDCM provided patient's son with list of private duty nursing agencies and explained it would be an out of pocket expense, explained services.  Caroline Medina reports that patient has a long term care poilcy on her insurance.  EDCM encouraged patient's son to call patient's insurance company to activate the policy.  EDCM also provided contact information for [facility], choice connections, [facility] and [facility] [place].  Patient's son agreeable to [facility] consult.  Consult placed.  Patient's son reports patient does not have any dme at home.  EDCM asked patient's son if patient will need a walker.  Patient's son agreeable to walker.  Walker placed at patient's bedside to go home with patient.  Patient's son agreeable to [facility] for dme services.  EDCM provided patient's son with list of home health agencies placing a star next to [facility]  EDCM informed patient's son that this agency has 24-48 hours to contact him for services.  The above information was also explained to [place] girlfriend Caroline Medina who reports  she will assist in calling resources provided for an aide and facility placement.  Patient and patient's family thankful for services.  No fur there EDCM needs at this time.   Expected Discharge Date:                  Expected Discharge Plan:  [place]  In-House Referral:  Clinical Social Work  Discharge planning Services  CM Consult  Post Acute Care Choice:  Durable Medical Equipment, Home Health Choice offered to:  Patient, Adult Children  DME Arranged:  Walker rolling DME Agency:  [place] Arranged:  RN, PT, OT, Nurse's Aide, Social Work [employer] Agency:  [facility]  Status of Service:  Completed, signed off  If discussed at [employer] [facility] [employer], dates discussed:    Additional Comments:  EDCM faxed home health referral to [facility] with confirmation of receipt.  Christen Bame, Camaron Cammack, RN 07/27/2016, 10:34 PM

## 2016-07-27 NOTE — Progress Notes (Signed)
CSW spoke with patients son, Caroline Medina, regarding plans for discharge. Patients son is requesting memory care/assisted living placement due to high number of falls. CSW explained to patients son that placement can not be made from the ED. Patients son stated they would be able to pay for a 24-hour sitter until they can find a facility. CSW contacted [facility], Amy, to help set up home health services.   Kingsley Spittle, [place] Clinical Social Worker [phone]

## 2016-07-27 NOTE — Discharge Instructions (Signed)
You have been given outpatient resources, home health referral and local agencies for follow up

## 2016-07-27 NOTE — ED Notes (Signed)
Bed: [facility] Expected date:  Expected time:  Means of arrival:  Comments: 

## 2016-07-28 DIAGNOSIS — S299XXA Unspecified injury of thorax, initial encounter: Secondary | ICD-10-CM | POA: Diagnosis not present

## 2016-07-28 DIAGNOSIS — I110 Hypertensive heart disease with heart failure: Secondary | ICD-10-CM | POA: Diagnosis not present

## 2016-07-28 DIAGNOSIS — E782 Mixed hyperlipidemia: Secondary | ICD-10-CM | POA: Diagnosis not present

## 2016-07-28 DIAGNOSIS — K219 Gastro-esophageal reflux disease without esophagitis: Secondary | ICD-10-CM | POA: Diagnosis not present

## 2016-07-28 DIAGNOSIS — I5032 Chronic diastolic (congestive) heart failure: Secondary | ICD-10-CM | POA: Diagnosis not present

## 2016-07-28 DIAGNOSIS — Z7982 Long term (current) use of aspirin: Secondary | ICD-10-CM | POA: Diagnosis not present

## 2016-07-28 DIAGNOSIS — G309 Alzheimer's disease, unspecified: Secondary | ICD-10-CM | POA: Diagnosis not present

## 2016-07-28 DIAGNOSIS — E1140 Type 2 diabetes mellitus with diabetic neuropathy, unspecified: Secondary | ICD-10-CM | POA: Diagnosis not present

## 2016-07-28 DIAGNOSIS — I252 Old myocardial infarction: Secondary | ICD-10-CM | POA: Diagnosis not present

## 2016-07-28 DIAGNOSIS — R9089 Other abnormal findings on diagnostic imaging of central nervous system: Secondary | ICD-10-CM | POA: Diagnosis not present

## 2016-07-28 DIAGNOSIS — Z9181 History of falling: Secondary | ICD-10-CM | POA: Diagnosis not present

## 2016-07-28 DIAGNOSIS — G301 Alzheimer's disease with late onset: Secondary | ICD-10-CM | POA: Diagnosis not present

## 2016-07-28 DIAGNOSIS — I2510 Atherosclerotic heart disease of native coronary artery without angina pectoris: Secondary | ICD-10-CM | POA: Diagnosis not present

## 2016-07-28 DIAGNOSIS — J45909 Unspecified asthma, uncomplicated: Secondary | ICD-10-CM | POA: Diagnosis not present

## 2016-07-29 ENCOUNTER — Other Ambulatory Visit: Payer: Self-pay

## 2016-07-29 DIAGNOSIS — I10 Essential (primary) hypertension: Secondary | ICD-10-CM | POA: Diagnosis not present

## 2016-07-29 DIAGNOSIS — G301 Alzheimer's disease with late onset: Secondary | ICD-10-CM | POA: Diagnosis not present

## 2016-07-29 DIAGNOSIS — R81 Glycosuria: Secondary | ICD-10-CM | POA: Diagnosis not present

## 2016-07-29 DIAGNOSIS — E119 Type 2 diabetes mellitus without complications: Secondary | ICD-10-CM | POA: Diagnosis not present

## 2016-07-29 DIAGNOSIS — Z8679 Personal history of other diseases of the circulatory system: Secondary | ICD-10-CM | POA: Diagnosis not present

## 2016-07-29 NOTE — Patient Outreach (Signed)
    Telephone call with patient's son, Sabino Gasser, who advised this RNCM he had his mother admitted to [facility] with the intent to discharge her to a long term care facility with a memory care unit at discharge.   Artis advise this RNCM the case manager at [facility] [employer] is helping him find a facility. Artis advised he does not feel he need any [facility] services at this time.  This RNCM advised Artis to call if assistance is needed.    Plan: Discharge patient from my caseload.

## 2016-07-30 DIAGNOSIS — E1165 Type 2 diabetes mellitus with hyperglycemia: Secondary | ICD-10-CM | POA: Diagnosis not present

## 2016-07-30 DIAGNOSIS — I088 Other rheumatic multiple valve diseases: Secondary | ICD-10-CM | POA: Diagnosis not present

## 2016-07-30 DIAGNOSIS — R296 Repeated falls: Secondary | ICD-10-CM | POA: Diagnosis not present

## 2016-07-30 DIAGNOSIS — I5032 Chronic diastolic (congestive) heart failure: Secondary | ICD-10-CM | POA: Diagnosis not present

## 2016-07-30 DIAGNOSIS — N200 Calculus of kidney: Secondary | ICD-10-CM | POA: Diagnosis not present

## 2016-07-30 DIAGNOSIS — E1122 Type 2 diabetes mellitus with diabetic chronic kidney disease: Secondary | ICD-10-CM | POA: Diagnosis not present

## 2016-07-30 DIAGNOSIS — E278 Other specified disorders of adrenal gland: Secondary | ICD-10-CM | POA: Diagnosis not present

## 2016-07-30 DIAGNOSIS — I129 Hypertensive chronic kidney disease with stage 1 through stage 4 chronic kidney disease, or unspecified chronic kidney disease: Secondary | ICD-10-CM | POA: Diagnosis not present

## 2016-07-30 DIAGNOSIS — I517 Cardiomegaly: Secondary | ICD-10-CM | POA: Diagnosis not present

## 2016-07-31 DIAGNOSIS — I5032 Chronic diastolic (congestive) heart failure: Secondary | ICD-10-CM | POA: Diagnosis not present

## 2016-07-31 DIAGNOSIS — E1122 Type 2 diabetes mellitus with diabetic chronic kidney disease: Secondary | ICD-10-CM | POA: Diagnosis not present

## 2016-07-31 DIAGNOSIS — I129 Hypertensive chronic kidney disease with stage 1 through stage 4 chronic kidney disease, or unspecified chronic kidney disease: Secondary | ICD-10-CM | POA: Diagnosis not present

## 2016-07-31 DIAGNOSIS — D3501 Benign neoplasm of right adrenal gland: Secondary | ICD-10-CM | POA: Diagnosis not present

## 2016-07-31 DIAGNOSIS — E1165 Type 2 diabetes mellitus with hyperglycemia: Secondary | ICD-10-CM | POA: Diagnosis not present

## 2016-08-01 DIAGNOSIS — I129 Hypertensive chronic kidney disease with stage 1 through stage 4 chronic kidney disease, or unspecified chronic kidney disease: Secondary | ICD-10-CM | POA: Diagnosis not present

## 2016-08-01 DIAGNOSIS — D3501 Benign neoplasm of right adrenal gland: Secondary | ICD-10-CM | POA: Diagnosis not present

## 2016-08-01 DIAGNOSIS — E1122 Type 2 diabetes mellitus with diabetic chronic kidney disease: Secondary | ICD-10-CM | POA: Diagnosis not present

## 2016-08-01 DIAGNOSIS — I5032 Chronic diastolic (congestive) heart failure: Secondary | ICD-10-CM | POA: Diagnosis not present

## 2016-08-01 DIAGNOSIS — E1165 Type 2 diabetes mellitus with hyperglycemia: Secondary | ICD-10-CM | POA: Diagnosis not present

## 2016-08-02 DIAGNOSIS — N200 Calculus of kidney: Secondary | ICD-10-CM | POA: Diagnosis not present

## 2016-08-02 DIAGNOSIS — D3501 Benign neoplasm of right adrenal gland: Secondary | ICD-10-CM | POA: Diagnosis not present

## 2016-08-02 DIAGNOSIS — E1122 Type 2 diabetes mellitus with diabetic chronic kidney disease: Secondary | ICD-10-CM | POA: Diagnosis not present

## 2016-08-02 DIAGNOSIS — R296 Repeated falls: Secondary | ICD-10-CM | POA: Diagnosis not present

## 2016-08-02 DIAGNOSIS — J449 Chronic obstructive pulmonary disease, unspecified: Secondary | ICD-10-CM | POA: Diagnosis not present

## 2016-08-02 DIAGNOSIS — I5032 Chronic diastolic (congestive) heart failure: Secondary | ICD-10-CM | POA: Diagnosis not present

## 2016-08-02 DIAGNOSIS — E1165 Type 2 diabetes mellitus with hyperglycemia: Secondary | ICD-10-CM | POA: Diagnosis not present

## 2016-08-04 DIAGNOSIS — I5032 Chronic diastolic (congestive) heart failure: Secondary | ICD-10-CM | POA: Diagnosis not present

## 2016-08-04 DIAGNOSIS — N200 Calculus of kidney: Secondary | ICD-10-CM | POA: Diagnosis not present

## 2016-08-04 DIAGNOSIS — R296 Repeated falls: Secondary | ICD-10-CM | POA: Diagnosis not present

## 2016-08-04 DIAGNOSIS — J449 Chronic obstructive pulmonary disease, unspecified: Secondary | ICD-10-CM | POA: Diagnosis not present

## 2016-08-06 DIAGNOSIS — R2681 Unsteadiness on feet: Secondary | ICD-10-CM | POA: Diagnosis not present

## 2016-08-06 DIAGNOSIS — R2689 Other abnormalities of gait and mobility: Secondary | ICD-10-CM | POA: Diagnosis not present

## 2016-08-06 DIAGNOSIS — M6281 Muscle weakness (generalized): Secondary | ICD-10-CM | POA: Diagnosis not present

## 2016-08-06 DIAGNOSIS — Z9181 History of falling: Secondary | ICD-10-CM | POA: Diagnosis not present

## 2016-08-06 DIAGNOSIS — I2510 Atherosclerotic heart disease of native coronary artery without angina pectoris: Secondary | ICD-10-CM | POA: Diagnosis not present

## 2016-08-06 DIAGNOSIS — R296 Repeated falls: Secondary | ICD-10-CM | POA: Diagnosis not present

## 2016-08-06 DIAGNOSIS — I10 Essential (primary) hypertension: Secondary | ICD-10-CM | POA: Diagnosis not present

## 2016-08-06 DIAGNOSIS — E1169 Type 2 diabetes mellitus with other specified complication: Secondary | ICD-10-CM | POA: Diagnosis not present

## 2016-08-06 DIAGNOSIS — R269 Unspecified abnormalities of gait and mobility: Secondary | ICD-10-CM | POA: Diagnosis not present

## 2016-08-06 DIAGNOSIS — I5030 Unspecified diastolic (congestive) heart failure: Secondary | ICD-10-CM | POA: Diagnosis not present

## 2016-08-06 DIAGNOSIS — I5032 Chronic diastolic (congestive) heart failure: Secondary | ICD-10-CM | POA: Diagnosis not present

## 2016-08-09 NOTE — Progress Notes (Signed)
08/09/2016 1638pm EDCM reviewed patient's chart for follow up.  Per chart review, patient has been admitted to [facility] and will be assisting patient to be admitted to long term care facility.  No further EDCM needs at this time.

## 2016-08-12 DIAGNOSIS — I5030 Unspecified diastolic (congestive) heart failure: Secondary | ICD-10-CM | POA: Diagnosis not present

## 2016-08-12 DIAGNOSIS — R269 Unspecified abnormalities of gait and mobility: Secondary | ICD-10-CM | POA: Diagnosis not present

## 2016-08-12 DIAGNOSIS — I10 Essential (primary) hypertension: Secondary | ICD-10-CM | POA: Diagnosis not present

## 2016-08-12 DIAGNOSIS — I2510 Atherosclerotic heart disease of native coronary artery without angina pectoris: Secondary | ICD-10-CM | POA: Diagnosis not present

## 2016-09-23 DIAGNOSIS — N182 Chronic kidney disease, stage 2 (mild): Secondary | ICD-10-CM | POA: Diagnosis not present

## 2016-09-23 DIAGNOSIS — J449 Chronic obstructive pulmonary disease, unspecified: Secondary | ICD-10-CM | POA: Diagnosis not present

## 2016-09-23 DIAGNOSIS — I509 Heart failure, unspecified: Secondary | ICD-10-CM | POA: Diagnosis not present

## 2016-09-23 DIAGNOSIS — I10 Essential (primary) hypertension: Secondary | ICD-10-CM | POA: Diagnosis not present

## 2016-10-06 ENCOUNTER — Ambulatory Visit: Payer: Medicare Other | Admitting: Nurse Practitioner

## 2016-10-07 ENCOUNTER — Encounter: Payer: Self-pay | Admitting: Nurse Practitioner

## 2019-03-21 ENCOUNTER — Telehealth: Payer: Self-pay | Admitting: Adult Health Nurse Practitioner

## 2019-03-21 NOTE — Telephone Encounter (Signed)
Tried calling facility to set up telehealth appointment for new referral.  Was directed to VM of Caroline Medina.  Left VM with contact info. Kristoph Sattler K. Olena Heckle NP

## 2019-03-21 NOTE — Telephone Encounter (Signed)
Talked with Clemens Catholic NP at facility as was directed to contact director for help with telehealth.  Left VM with Ty Hilts, director, with contact info and reason for call to set up telehealth for new referral. Allaya Abbasi K. Olena Heckle NP

## 2019-03-21 NOTE — Telephone Encounter (Signed)
Tried calling facility to set up telehealth for new referral.  Main reception answered.  When redirected no one picked up and was directed to VM box to leave a message. Shiori Adcox K. Olena Heckle NP

## 2019-04-02 ENCOUNTER — Other Ambulatory Visit: Payer: Self-pay

## 2019-04-02 ENCOUNTER — Non-Acute Institutional Stay: Payer: Medicare Other | Admitting: Adult Health Nurse Practitioner

## 2019-04-02 DIAGNOSIS — Z515 Encounter for palliative care: Secondary | ICD-10-CM

## 2019-04-02 NOTE — Progress Notes (Signed)
[facility] Consult Note Telephone: [phone]  Fax: [phone]  PATIENT NAME: Caroline Medina DOB: 11-14-1940 MRN: [id number]  PRIMARY CARE PROVIDER:   Dr. Wenda Low  REFERRING PROVIDER: Clemens Catholic NP  RESPONSIBLE PARTY:   Virginia Curl, son  [phone]   Due to the COVID-19 crisis, this visit was done via telemedicine and it was initiated and consent by this patient and or family.  Cecelia Byars, SW, helped with visit set up and patient information.   RECOMMENDATIONS and PLAN:  1.  Dementia.  FAST 6.  Patient has had a decline in mobility.  Used to be able to walk for short distances.  Now is mostly chair bound.  Though patient states her appetite is good, staff reports that she is not eating as much.  No reports of weight changes.  Patient not speaking much today but appears happy in NAD.  No reports of anxiety or agitation.  Patient has had some decline in mobility and this is most likely due to disease progression.  Spoke with provider, Clemens Catholic NP, last week who stated that she did write an order for a PT referral.  2.  [place].  Patient is a full code.  Tried calling son, Blaike Newburn, and was disconnected.  Called back and left a message with contact number to call back.  I spent 30 minutes providing this consultation,  from 11:20 to 11:50. More than 50% of the time in this consultation was spent coordinating communication.   HISTORY OF PRESENT ILLNESS:  Caroline Medina is a 78 y.o. year old female with multiple medical problems including dementia, DMT2, HTN, COPD, OA. Palliative Care was asked to help address goals of care.   CODE STATUS: Full Code  PPS: 50% HOSPICE ELIGIBILITY/DIAGNOSIS: TBD  PHYSICAL EXAM:   General: patient sitting in WC in NAD Neurological: Weakness; A&0 to person and place  PAST MEDICAL HISTORY:  Past Medical History:  Diagnosis Date  . Adrenal adenoma (bilateral)  10/14/2013  . Asthma 10/14/2013  . Chronic diastolic CHF (congestive heart failure) ([place])   . COPD (chronic obstructive pulmonary disease) ([place])   . Coronary artery disease    a. with RCA DES 09/2007. b. NSTEMI 09/2013 s/p DES to Cx with residual moderate LAD/RCA stenosis, EF 55%.  . Depressive disorder, not elsewhere classified   . Diabetes mellitus   . Epistaxis    allergic rhinnitis plus plavix  . Essential hypertension   . GERD (gastroesophageal reflux disease)   . Hyperlipidemia   . Kidney stones   . Memory loss   . Obesity (BMI 30-39.9) 10/14/2013    SOCIAL HX:  Social History   Tobacco Use  . Smoking status: Current Every Day Smoker    Packs/day: 1.00    Years: 50.00    Pack years: 50.00    Types: Cigarettes  . Smokeless tobacco: Never Used  Substance Use Topics  . Alcohol use: Yes    ALLERGIES:  Allergies  Allergen Reactions  . Lisinopril Other (See Comments)    cough  . Penicillins Rash    Has patient had a PCN reaction causing immediate rash, facial/tongue/throat swelling, SOB or lightheadedness with hypotension: Yes Has patient had a PCN reaction causing severe rash involving mucus membranes or skin necrosis: No Has patient had a PCN reaction that required hospitalization No Has patient had a PCN reaction occurring within the last 10 years: Yes If all of the above answers are "NO",  then may proceed with Cephalosporin use.      PERTINENT MEDICATIONS:  Outpatient Encounter Medications as of 04/02/2019  Medication Sig  . acetaminophen (TYLENOL) 500 MG tablet Take 1 tablet (500 mg total) by mouth every 6 (six) hours as needed.  Marland Kitchen amitriptyline (ELAVIL) 25 MG tablet Take 1 tablet (25 mg total) by mouth at bedtime.  Marland Kitchen aspirin 81 MG tablet Take 81 mg by mouth daily.    . cloNIDine (CATAPRES) 0.1 MG tablet Take by mouth 2 (two) times daily.    Marland Kitchen diltiazem (DILACOR XR) 240 MG 24 hr capsule   . folic acid (FOLVITE) 1 MG tablet Take 1 mg by mouth daily.  Marland Kitchen  gabapentin (NEURONTIN) 300 MG capsule Take 300 mg by mouth 2 (two) times daily.  Marland Kitchen glipizide-metformin (METAGLIP) 2.5-250 MG per tablet Take 1 tablet by mouth 2 (two) times daily before a meal.   . losartan (COZAAR) 100 MG tablet Take 100 mg by mouth daily.   . memantine (NAMENDA TITRATION PAK) tablet pack 5 mg/day for =1 week; 5 mg twice daily for =1 week; 15 mg/day given in 5 mg and 10 mg separated doses for =1 week; then 10 mg twice daily  . Multiple Vitamins-Minerals (MULTIVITAMIN WITH MINERALS) tablet Take 1 tablet by mouth daily.  . nitroGLYCERIN (NITROSTAT) 0.4 MG SL tablet Place 1 tablet (0.4 mg total) under the tongue every 5 (five) minutes as needed for chest pain.  Marland Kitchen omeprazole (PRILOSEC) 20 MG capsule Take 20 mg by mouth Daily.   . potassium chloride SA (K-DUR,KLOR-CON) 20 MEQ tablet Take 20 mEq by mouth daily.    No facility-administered encounter medications on file as of 04/02/2019.       Rosy Estabrook Jenetta Downer, NP

## 2019-04-25 ENCOUNTER — Non-Acute Institutional Stay: Payer: Medicare Other | Admitting: Adult Health Nurse Practitioner

## 2019-04-25 ENCOUNTER — Other Ambulatory Visit: Payer: Self-pay

## 2019-04-25 DIAGNOSIS — Z515 Encounter for palliative care: Secondary | ICD-10-CM

## 2019-04-25 NOTE — Progress Notes (Signed)
[facility] Consult Note Telephone: [phone]  Fax: [phone]  PATIENT NAME: Caroline Medina DOB: 12/28/1940 MRN: [id number]  PRIMARY CARE PROVIDER:  Dr. Wenda Low  REFERRING PROVIDER: Clemens Catholic NP  RESPONSIBLE PARTY:   Caroline Medina, son  [phone]             Caroline Medina, DIL [phone]    RECOMMENDATIONS and PLAN:  1.  Dementia.  FAST 7 maybe 7b with more information. She is incontinent of B&B.  A couple of months ago she was walking with a walker and does not walk now.  She is usually in her wheelchair or bed.  Has recurrent falls. Speaks only a few words but when I talk with her she doesn't answer my questions. She will nod and smile at me.  She has gone from eating 75-100% of her meals to only eating 25-50% now.  Has not had a significant weight loss yet but has lost 8 pounds in the past few months.  She is currently being treated for aspiration pneumonia and has had to have her diet downgraded to puree.  She is COVID negative.    2.  Goals of care.  Have spoken with daughter in law, Caroline Medina, about patient's decline.  She had questions about hospice and patient's decline, and I attempted to answer them.  After discussion among family, she called back and agreed to go with hospice.  Let provider at facility know.    I spent 45 minutes providing this consultation,  from 10:00 to 10:45. More than 50% of the time in this consultation was spent coordinating communication.   HISTORY OF PRESENT ILLNESS:  Caroline Medina is a 78 y.o. year old female with multiple medical problems including dementia, DMT2, HTN, COPD, OA. Palliative Care was asked to help address goals of care.   CODE STATUS:   PPS:40% HOSPICE ELIGIBILITY/DIAGNOSIS: yes/dementia    PAST MEDICAL HISTORY:  Past Medical History:  Diagnosis Date  . Adrenal adenoma (bilateral) 10/14/2013  . Asthma 10/14/2013  . Chronic diastolic CHF  (congestive heart failure) ([place])   . COPD (chronic obstructive pulmonary disease) ([place])   . Coronary artery disease    a. with RCA DES 09/2007. b. NSTEMI 09/2013 s/p DES to Cx with residual moderate LAD/RCA stenosis, EF 55%.  . Depressive disorder, not elsewhere classified   . Diabetes mellitus   . Epistaxis    allergic rhinnitis plus plavix  . Essential hypertension   . GERD (gastroesophageal reflux disease)   . Hyperlipidemia   . Kidney stones   . Memory loss   . Obesity (BMI 30-39.9) 10/14/2013    SOCIAL HX:  Social History   Tobacco Use  . Smoking status: Current Every Day Smoker    Packs/day: 1.00    Years: 50.00    Pack years: 50.00    Types: Cigarettes  . Smokeless tobacco: Never Used  Substance Use Topics  . Alcohol use: Yes    ALLERGIES:  Allergies  Allergen Reactions  . Lisinopril Other (See Comments)    cough  . Penicillins Rash    Has patient had a PCN reaction causing immediate rash, facial/tongue/throat swelling, SOB or lightheadedness with hypotension: Yes Has patient had a PCN reaction causing severe rash involving mucus membranes or skin necrosis: No Has patient had a PCN reaction that required hospitalization No Has patient had a PCN reaction occurring within the last 10 years: Yes If all of the above answers are "  NO", then may proceed with Cephalosporin use.      PERTINENT MEDICATIONS:  Outpatient Encounter Medications as of 04/25/2019  Medication Sig  . acetaminophen (TYLENOL) 500 MG tablet Take 1 tablet (500 mg total) by mouth every 6 (six) hours as needed.  Marland Kitchen amitriptyline (ELAVIL) 25 MG tablet Take 1 tablet (25 mg total) by mouth at bedtime.  Marland Kitchen aspirin 81 MG tablet Take 81 mg by mouth daily.    . cloNIDine (CATAPRES) 0.1 MG tablet Take by mouth 2 (two) times daily.    Marland Kitchen diltiazem (DILACOR XR) 240 MG 24 hr capsule   . folic acid (FOLVITE) 1 MG tablet Take 1 mg by mouth daily.  Marland Kitchen gabapentin (NEURONTIN) 300 MG capsule Take 300 mg by mouth 2 (two)  times daily.  Marland Kitchen glipizide-metformin (METAGLIP) 2.5-250 MG per tablet Take 1 tablet by mouth 2 (two) times daily before a meal.   . losartan (COZAAR) 100 MG tablet Take 100 mg by mouth daily.   . memantine (NAMENDA TITRATION PAK) tablet pack 5 mg/day for =1 week; 5 mg twice daily for =1 week; 15 mg/day given in 5 mg and 10 mg separated doses for =1 week; then 10 mg twice daily  . Multiple Vitamins-Minerals (MULTIVITAMIN WITH MINERALS) tablet Take 1 tablet by mouth daily.  . nitroGLYCERIN (NITROSTAT) 0.4 MG SL tablet Place 1 tablet (0.4 mg total) under the tongue every 5 (five) minutes as needed for chest pain.  Marland Kitchen omeprazole (PRILOSEC) 20 MG capsule Take 20 mg by mouth Daily.   . potassium chloride SA (K-DUR,KLOR-CON) 20 MEQ tablet Take 20 mEq by mouth daily.    No facility-administered encounter medications on file as of 04/25/2019.        Jenetta Downer, NP

## 2019-06-26 DEATH — deceased

## 2022-03-16 ENCOUNTER — Encounter: Payer: Self-pay | Admitting: Gastroenterology
